# Patient Record
Sex: Female | Born: 1995
Health system: Southern US, Community
[De-identification: ages and names within clinical notes are randomized; demographics above are authoritative.]

## PROBLEM LIST (undated history)

## (undated) ENCOUNTER — Inpatient Hospital Stay: Payer: Self-pay

## (undated) DIAGNOSIS — K219 Gastro-esophageal reflux disease without esophagitis: Secondary | ICD-10-CM

## (undated) DIAGNOSIS — Z789 Other specified health status: Secondary | ICD-10-CM

## (undated) DIAGNOSIS — F419 Anxiety disorder, unspecified: Secondary | ICD-10-CM

## (undated) HISTORY — PX: INDUCED ABORTION: SHX677

## (undated) HISTORY — DX: Other specified health status: Z78.9

## (undated) HISTORY — PX: NO PAST SURGERIES: SHX2092

---
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
## 2015-10-10 ENCOUNTER — Encounter: Payer: Self-pay | Admitting: *Deleted

## 2015-10-10 ENCOUNTER — Observation Stay
Admission: EM | Admit: 2015-10-10 | Discharge: 2015-10-10 | Disposition: A | Discharge status: Home / Self Care | Payer: Medicaid Other | Attending: Certified Nurse Midwife | Admitting: Certified Nurse Midwife

## 2015-10-10 DIAGNOSIS — B373 Candidiasis of vulva and vagina: Secondary | ICD-10-CM | POA: Diagnosis not present

## 2015-10-10 DIAGNOSIS — Z3A34 34 weeks gestation of pregnancy: Secondary | ICD-10-CM | POA: Diagnosis not present

## 2015-10-10 DIAGNOSIS — O26893 Other specified pregnancy related conditions, third trimester: Secondary | ICD-10-CM | POA: Diagnosis not present

## 2015-10-10 DIAGNOSIS — O26852 Spotting complicating pregnancy, second trimester: Secondary | ICD-10-CM | POA: Diagnosis present

## 2015-10-10 DIAGNOSIS — O4693 Antepartum hemorrhage, unspecified, third trimester: Secondary | ICD-10-CM | POA: Diagnosis present

## 2015-10-10 MED ORDER — LACTATED RINGERS IV SOLN: 500.0000 mL | INTRAVENOUS | Status: DC | PRN

## 2015-10-10 NOTE — Progress Notes (Addendum)
   L&D Triage note  19 year old G3 P1011 with EDC=01/24/16 by LMP=11 week ultrasound presents at 24.6 weeks with c/o vaginal spotting that she noticed this Am when wiping. Has not had intercourse in the past 24 hours. No dysuria or fever. Has had a mucoid discharge x 3 weeks, but no vulvar irritation or itching. Has had some lower abdominal cramping intermittently and pain over pubic bone-especially when bending/moving etc. [facility]. Current medications: PNV NKDA  Exam: BP 128/64 mmHg  Pulse 88  Temp(Src) 98.4 F (36.9 C) (Oral)  Resp 16  General : in no acute distress Abdomen: soft, NT. Mild TTP over symphysis pubis FHR 140s with accelerations to 150s Toco: no contractions seen Spec exam: Vulva: inflammation of vestibule Vagina: clear to white mucoid discharge, no blood seen Cervix: closed/long/OOP Wet prep: positive increased WBC, a couple hyphae; no Trich or clue cells  A: Monilia vaginitis probably causing inflammation and some resulting spotting No evidence of PTL Age appropriate FHR tracing  P: DC home with RX for Terazol 3 cream FU at office as scheduled and prn persistent sx.  Farrel ConnersGUTIERREZ, Kenon Delashmit, CNM

## 2015-10-10 NOTE — OB Triage Note (Signed)
Recvd to OBS2 from ED per wheelchair with c/o bleeding when wiping this AM.  States that she had some cramping on the way to the hospital and thinks that she lost her mucous plug last week.  Changed to gown and to bed.  EFM applied.  Oriented to room and discussed plan of care.  Verbalized understanding and agreement with plan.

## 2015-12-08 NOTE — L&D Delivery Note (Signed)
VAGINAL DELIVERY NOTE:  Date of Delivery: 01/17/2016 Primary OB: [facility]  Gestational Age/EDD: [redacted]w[redacted]d 01/24/2016, by Other Basis Antepartum complications: Precipitous labor and delivery Attending Physician: Annamarie Major, MD, FACOG Delivery Type: spontaneous vaginal delivery  Anesthesia: none Laceration: vaginal Episiotomy: none Placenta: spontaneous Intrapartum complications: None Estimated Blood Loss: 100 mL GBS: Neg Procedure Details: Pt was having frequent ctxs with no cervic al change; Morphine / Phenergan IM given.  A few hours later pt awoke and was 10 cm, SROM, and soon precipitous delivery (I was present for all of pushing for 2 ctxs and delivery).  Right vaginal small side wall tear repaired under local.  Baby: Liveborn female, Apgars 8/9

## 2016-01-15 ENCOUNTER — Observation Stay
Admission: EM | Admit: 2016-01-15 | Discharge: 2016-01-16 | Disposition: A | Discharge status: Home / Self Care | Payer: Commercial Managed Care - HMO | Source: Home / Self Care | Admitting: Obstetrics & Gynecology

## 2016-01-16 NOTE — Discharge Summary (Signed)
20 yo G2P1001 at [redacted]w[redacted]d presents for labor evaluation. Cervix 2 cm per RN.   FHR strip reviewed by me: category 1 tracing with baseline 135 with mod variability, + accels, no decels. Ctx irregular.  Pt d/c home with labor precautions.  Given note be out of work tomorrow and rest. Will f/u at scheduled appt on 2/10.

## 2016-01-17 ENCOUNTER — Inpatient Hospital Stay
Admission: EM | Admit: 2016-01-17 | Discharge: 2016-01-18 | DRG: 775 | Disposition: A | Discharge status: Home / Self Care | Payer: Commercial Managed Care - HMO | Attending: Obstetrics & Gynecology | Admitting: Obstetrics & Gynecology

## 2016-01-17 ENCOUNTER — Encounter: Payer: Self-pay | Admitting: *Deleted

## 2016-01-17 DIAGNOSIS — Z3483 Encounter for supervision of other normal pregnancy, third trimester: Secondary | ICD-10-CM | POA: Diagnosis present

## 2016-01-17 DIAGNOSIS — Z3A39 39 weeks gestation of pregnancy: Secondary | ICD-10-CM

## 2016-01-17 DIAGNOSIS — O479 False labor, unspecified: Secondary | ICD-10-CM | POA: Diagnosis present

## 2016-01-17 MED ORDER — ONDANSETRON HCL 4 MG PO TABS: 4.0000 mg | ORAL_TABLET | ORAL | Status: DC | PRN

## 2016-01-17 MED ORDER — LANOLIN HYDROUS EX OINT: TOPICAL_OINTMENT | CUTANEOUS | Status: DC | PRN

## 2016-01-17 MED ORDER — IBUPROFEN 600 MG PO TABS
600.0000 mg | ORAL_TABLET | Freq: Four times a day (QID) | ORAL | Status: DC
  Administered 2016-01-17 – 2016-01-18 (×5): 600 mg via ORAL
  Filled 2016-01-17 (×5): qty 1

## 2016-01-17 MED ORDER — PROMETHAZINE HCL 25 MG/ML IJ SOLN
INTRAMUSCULAR | Status: AC
  Filled 2016-01-17: qty 1

## 2016-01-17 MED ORDER — MORPHINE SULFATE (PF) 10 MG/ML IV SOLN
INTRAVENOUS | Status: AC
  Filled 2016-01-17: qty 1

## 2016-01-17 MED ORDER — OXYTOCIN 10 UNIT/ML IJ SOLN
10.0000 [IU] | Freq: Once | INTRAMUSCULAR | Status: DC
  Filled 2016-01-17: qty 1

## 2016-01-17 MED ORDER — DIPHENHYDRAMINE HCL 25 MG PO CAPS: 25.0000 mg | ORAL_CAPSULE | Freq: Four times a day (QID) | ORAL | Status: DC | PRN

## 2016-01-17 MED ORDER — ONDANSETRON HCL 4 MG/2ML IJ SOLN: 4.0000 mg | INTRAMUSCULAR | Status: DC | PRN

## 2016-01-17 MED ORDER — PROMETHAZINE HCL 25 MG/ML IJ SOLN
12.5000 mg | Freq: Once | INTRAMUSCULAR | Status: AC
  Administered 2016-01-17: 25 mg via INTRAMUSCULAR

## 2016-01-17 MED ORDER — WITCH HAZEL-GLYCERIN EX PADS
1.0000 | MEDICATED_PAD | CUTANEOUS | Status: DC | PRN
Start: 2016-01-17 — End: 2016-01-18

## 2016-01-17 MED ORDER — OXYCODONE-ACETAMINOPHEN 5-325 MG PO TABS
1.0000 | ORAL_TABLET | ORAL | Status: DC | PRN
Start: 2016-01-17 — End: 2016-01-18
  Administered 2016-01-17 – 2016-01-18 (×2): 1 via ORAL
  Filled 2016-01-17 (×2): qty 1

## 2016-01-17 MED ORDER — SIMETHICONE 80 MG PO CHEW: 80.0000 mg | CHEWABLE_TABLET | ORAL | Status: DC | PRN

## 2016-01-17 MED ORDER — OXYCODONE-ACETAMINOPHEN 5-325 MG PO TABS: 2.0000 | ORAL_TABLET | ORAL | Status: DC | PRN

## 2016-01-17 MED ORDER — SENNOSIDES-DOCUSATE SODIUM 8.6-50 MG PO TABS
2.0000 | ORAL_TABLET | ORAL | Status: DC
  Administered 2016-01-17: 2 via ORAL
  Filled 2016-01-17: qty 2

## 2016-01-17 MED ORDER — ACETAMINOPHEN 325 MG PO TABS: 650.0000 mg | ORAL_TABLET | ORAL | Status: DC | PRN

## 2016-01-17 MED ORDER — BENZOCAINE-MENTHOL 20-0.5 % EX AERO
1.0000 "application " | INHALATION_SPRAY | CUTANEOUS | Status: DC | PRN
  Administered 2016-01-17: 1 via TOPICAL
  Filled 2016-01-17: qty 56

## 2016-01-17 MED ORDER — ZOLPIDEM TARTRATE 5 MG PO TABS: 5.0000 mg | ORAL_TABLET | Freq: Every evening | ORAL | Status: DC | PRN

## 2016-01-17 MED ORDER — MORPHINE SULFATE (PF) 10 MG/ML IV SOLN: 10.0000 mg | Freq: Once | INTRAVENOUS | Status: DC

## 2016-01-17 MED ORDER — DIBUCAINE 1 % RE OINT: 1.0000 "application " | TOPICAL_OINTMENT | RECTAL | Status: DC | PRN

## 2016-01-17 MED ORDER — ONDANSETRON HCL 4 MG/2ML IJ SOLN: 4.0000 mg | Freq: Four times a day (QID) | INTRAMUSCULAR | Status: DC | PRN

## 2016-01-17 MED ORDER — OXYTOCIN 40 UNITS IN LACTATED RINGERS INFUSION - SIMPLE MED
INTRAVENOUS | Status: AC
  Filled 2016-01-17: qty 1000

## 2016-01-17 NOTE — OB Triage Note (Signed)
Patient presents with c/o contractions since yesterday morning.  Denies and s/s srom, denies any vaginal bleeding.  efm and toco applied.  fhr-138.  Reports active fetus.  UC palpate mild-moderate.  Patient states she is tired since she hasn't slept in 2 days.  sve performed, cervix posterior

## 2016-01-17 NOTE — Discharge Instructions (Signed)

## 2016-01-17 NOTE — Discharge Summary (Signed)
  Obstetrical Discharge Summary  Date of Admission: 01/17/2016 Date of Discharge: 01/18/2016 Discharge Diagnosis: Term Pregnancy-delivered Primary OB:  [facility]   Gestational Age at Delivery: [redacted]w[redacted]d  Antepartum complications: none Date of Delivery: 01/17/16  Delivered By: Tiburcio Pea Delivery Type: spontaneous vaginal delivery Intrapartum complications/course: Precipitous Anesthesia: none Placenta: spontaneous Laceration: vaginal Episiotomy: none Live born Female Birth Weight:  7#6.5oz APGAR: 8, 9   Post partum course: Since the delivery, patient has tolerated activity, diet, and daily functions without difficulty or complication.  Min lochia.  Breast feeding and having some nipple soreness..  No signs of depression currently.   Postpartum Exam:General appearance: alert, cooperative and no distress GI: Fundus firm and ML/ U-1/ NT Extremities: Homans sign is negative, no sign of DVT Lochia appropriate BP 112/63 mmHg  Pulse 97  Temp(Src) 97.7 F (36.5 C) (Oral)  Resp 18  Ht  (1.651 m)  Wt 79.379 kg (175 lb)  BMI 29.12 kg/m2  SpO2 100%  Breastfeeding? Unknown   Disposition: home with infant/ Kelly Hudson Rh Immune globulin given: not applicable Rubella vaccine given: not applicable Varicella vaccine given: not applicable Tdap vaccine given in AP or PP setting: given during prenatal care Flu vaccine given in AP or PP setting: given during prenatal care Contraception: Nexplanon-can come to office in 1-2 weeks to order  Results for orders placed or performed during the hospital encounter of 01/17/16 (from the past 24 hour(s))  CBC     Status: Abnormal   Collection Time: 01/18/16  6:00 AM  Result Value Ref Range   WBC 18.1 (H) 3.6 - 11.0 K/uL   RBC 3.57 (L) 3.80 - 5.20 MIL/uL   Hemoglobin 10.2 (L) 12.0 - 16.0 g/dL   HCT 40.9 (L) 81.1 - 91.4 %   MCV 86.2 80.0 - 100.0 fL   MCH 28.6 26.0 - 34.0 pg   MCHC 33.1 32.0 - 36.0 g/dL   RDW 78.2 95.6 - 21.3 %   Platelets 151 150 - 440  K/uL   Prenatal Labs: O+//Rubella Immune//Varicella Immune//RPR negative//HIV negative/HepB Surface Ag negative//plans to breastfeed  Plan:  Kelly Hudson was discharged to home in good condition. Follow-up appointment with [facility] provider in 6 weeks  Discharge Medications:   Medication List    TAKE these medications        ibuprofen 600 MG tablet  Commonly known as:  ADVIL,MOTRIN  Take 1 tablet (600 mg total) by mouth every 6 (six) hours as needed.     oxyCODONE-acetaminophen 5-325 MG tablet  Commonly known as:  PERCOCET/ROXICET  Take 1 tablet by mouth every 4 (four) hours as needed for moderate pain or severe pain.     prenatal vitamin w/FE, FA 27-1 MG Tabs tablet  TK 1 T PO QD BETWEEN MEALS         Kelly Hudson, CNM

## 2016-01-17 NOTE — H&P (Signed)
Obstetrics Admission History & Physical   CC: Contractions   HPI:  21 y.o. G2P1001 @ [redacted]w[redacted]d (01/24/2016, by Other Basis). Admitted on 01/17/2016:   Patient Active Problem List   Diagnosis Date Noted  . Labor and delivery, indication for care 01/16/2016  . Spotting affecting pregnancy in second trimester 10/10/2015     Presents for Pain from reg ctxs, no VB or ROM.  Prenatal care at: at [facility]  PMHx: No past medical history on file. PSHx: No past surgical history on file. Medications:  Prescriptions prior to admission  Medication Sig Dispense Refill Last Dose  . prenatal vitamin w/FE, FA (PRENATAL 1 + 1) 27-1 MG TABS tablet TK 1 T PO QD BETWEEN MEALS  9 Unknown at Unknown time   Allergies: has No Known Allergies. OBHx:  OB History  Gravida Para Term Preterm AB SAB TAB Ectopic Multiple Living  # Outcome Date GA Lbr Len/2nd Weight Sex Delivery Anes PTL Lv  2 Current           1 Term 01/13/11    M Vag-Spont  N Y     ZOX:[id number] except as detailed in HPI. Soc Hx: Pregnancy welcomed  Objective:   Filed Vitals:   01/17/16 0143  BP: 126/72  Pulse: 98  Temp: 97.7 F (36.5 C)   General: Well nourished, well developed female in no acute distress.  Skin: Warm and dry.  Cardiovascular:Regular rate and rhythm. Respiratory: Clear to auscultation bilateral. Normal respiratory effort Abdomen: mild tenderness, Vtx Neuro/Psych: Normal mood and affect.   Pelvic exam: is not limited by body habitus EGBUS: within normal limits Vagina: within normal limits and with normal mucosa blood in the vault Cervix: difficult due to low head and post cervix, 2/60 Uterus: Spontaneous uterine activity  Adnexa: not evaluated  EFM:FHR: 150 bpm, variability: moderate,  accelerations:  Present,  decelerations:  Absent Toco: Frequency: Every 5-8 minutes   Perinatal info:  Blood type: O positive Rubella- Immune Varicella -Immune TDaP Given during third trimester  of this pregnancy RPR NR / HIV Neg/ HBsAg Neg   Assessment & Plan:   20 y.o. G2P1001 @ [redacted]w[redacted]d, Admitted on 01/17/2016: evaluate for early labor    Observe for cervical change and Fetal Wellbeing Reassuring  As this is second visit to triage in 2 nights, will treat with pain medicine (IM Morphine) and allow to rest then re-evaluate; if cervical change then keep for labor mgt.  Annamarie Major, MD

## 2016-01-18 LAB — CBC
HEMATOCRIT: 30.8 % — AB (ref 35.0–47.0)
Hemoglobin: 10.2 g/dL — ABNORMAL LOW (ref 12.0–16.0)
MCH: 28.6 pg (ref 26.0–34.0)
MCHC: 33.1 g/dL (ref 32.0–36.0)
MCV: 86.2 fL (ref 80.0–100.0)
Platelets: 151 10*3/uL (ref 150–440)
RBC: 3.57 MIL/uL — ABNORMAL LOW (ref 3.80–5.20)
RDW: 14 % (ref 11.5–14.5)
WBC: 18.1 10*3/uL — ABNORMAL HIGH (ref 3.6–11.0)

## 2016-01-18 MED ORDER — OXYCODONE-ACETAMINOPHEN 5-325 MG PO TABS: 1.0000 | ORAL_TABLET | ORAL | Status: DC | PRN

## 2016-01-18 MED ORDER — IBUPROFEN 600 MG PO TABS: 600.0000 mg | ORAL_TABLET | Freq: Four times a day (QID) | ORAL | Status: DC | PRN

## 2016-01-18 NOTE — Progress Notes (Signed)
D/C order from MD.  Reviewed d/c instructions and prescriptions with patient and answered any questions.  Patient d/c home with infant via wheelchair by nursing/auxillary. 

## 2017-08-02 ENCOUNTER — Ambulatory Visit (INDEPENDENT_AMBULATORY_CARE_PROVIDER_SITE_OTHER): Payer: BLUE CROSS/BLUE SHIELD | Admitting: Obstetrics and Gynecology

## 2017-08-02 ENCOUNTER — Encounter: Payer: Self-pay | Admitting: Obstetrics and Gynecology

## 2017-08-02 VITALS — BP 100/60 | HR 72 | Ht 65.0 in | Wt 151.0 lb

## 2017-08-02 DIAGNOSIS — B373 Candidiasis of vulva and vagina: Secondary | ICD-10-CM | POA: Diagnosis not present

## 2017-08-02 DIAGNOSIS — Z113 Encounter for screening for infections with a predominantly sexual mode of transmission: Secondary | ICD-10-CM

## 2017-08-02 DIAGNOSIS — B3731 Acute candidiasis of vulva and vagina: Secondary | ICD-10-CM

## 2017-08-02 DIAGNOSIS — Z01419 Encounter for gynecological examination (general) (routine) without abnormal findings: Secondary | ICD-10-CM | POA: Diagnosis not present

## 2017-08-02 DIAGNOSIS — Z30011 Encounter for initial prescription of contraceptive pills: Secondary | ICD-10-CM

## 2017-08-02 DIAGNOSIS — Z124 Encounter for screening for malignant neoplasm of cervix: Secondary | ICD-10-CM | POA: Diagnosis not present

## 2017-08-02 LAB — POCT WET PREP WITH KOH
CLUE CELLS WET PREP PER HPF POC: NEGATIVE
KOH PREP POC: NEGATIVE
TRICHOMONAS UA: NEGATIVE

## 2017-08-02 MED ORDER — LEVONORGESTREL-ETHINYL ESTRAD 0.1-20 MG-MCG PO TABS: 1.0000 | ORAL_TABLET | Freq: Every day | ORAL | 12 refills | Status: DC

## 2017-08-02 MED ORDER — FLUCONAZOLE 150 MG PO TABS: 150.0000 mg | ORAL_TABLET | Freq: Once | ORAL | 0 refills | Status: AC

## 2017-08-02 NOTE — Progress Notes (Signed)
Chief Complaint  Patient presents with  . Gynecologic Exam     HPI:      [name] is a 21 y.o. [id number] who LMP was Patient's last menstrual period was 07/14/2017 (exact date)., presents today for her annual examination.  Her menses are regular every 28-30 days, lasting 4 days.  Dysmenorrhea mild, occurring first 1-2 days of flow. She does not have intermenstrual bleeding.  Sex activity: single partner, contraception - none. She was on OCPs from the health dept but ran out a couple months ago. She is not using condoms. She would like to restart OCPs.  Last Pap: June 21, 2015  Results were: no abnormalities Hx of STDs: none  There is no FH of breast cancer. There is no FH of ovarian cancer. The patient does do self-breast exams.  Tobacco use: The patient denies current or previous tobacco use. Alcohol use: social drinker No drug use.  Exercise: moderately active  She does not get adequate calcium and Vitamin D in her diet.  She has noticed mild vaginal irritation for the past few days, with an increased d/c, no odor. She has been swimming a lot recently. No recent abx use, no meds to treat.  Past Medical History:  Diagnosis Date  . No known health problems     Past Surgical History:  Procedure Laterality Date  . NO PAST SURGERIES      Family History  Problem Relation Age of Onset  . Other Maternal Grandmother        brain tumor  . Diabetes Paternal Grandfather   . Cancer Neg Hx   . Hypertension Neg Hx     Social History   Social History  . Marital status: Single    Spouse name: N/A  . Number of children: N/A  . Years of education: N/A   Occupational History  . Not on file.   Social History Main Topics  . Smoking status: Never Smoker  . Smokeless tobacco: Never Used  . Alcohol use Yes  . Drug use: No  . Sexual activity: Yes    Birth control/ protection: None   Other Topics Concern  . Not on file   Social History Narrative  . No  narrative on file    No outpatient prescriptions have been marked as taking for the 08/02/17 encounter (Office Visit) with Copland, Ilona Sorrel, PA-C.     ROS:  Review of Systems  Constitutional: Negative for fatigue, fever and unexpected weight change.  Respiratory: Negative for cough, shortness of breath and wheezing.   Cardiovascular: Negative for chest pain, palpitations and leg swelling.  Gastrointestinal: Negative for blood in stool, constipation, diarrhea, nausea and vomiting.  Endocrine: Negative for cold intolerance, heat intolerance and polyuria.  Genitourinary: Positive for vaginal discharge. Negative for dyspareunia, dysuria, flank pain, frequency, genital sores, hematuria, menstrual problem, pelvic pain, urgency, vaginal bleeding and vaginal pain.  Musculoskeletal: Negative for back pain, joint swelling and myalgias.  Skin: Negative for rash.  Neurological: Negative for dizziness, syncope, light-headedness, numbness and headaches.  Hematological: Negative for adenopathy.  Psychiatric/Behavioral: Negative for agitation, confusion, sleep disturbance and suicidal ideas. The patient is not nervous/anxious.      Objective: BP 100/60   Pulse 72   Ht 5\' 5"  (1.651 m)   Wt 151 lb (68.5 kg)   LMP 07/14/2017 (Exact Date)   BMI 25.13 kg/m    Physical Exam  Constitutional: She is oriented to person, place, and time. She appears well-developed  and well-nourished.  Genitourinary: Vagina normal and uterus normal. There is no rash or tenderness on the right labia. There is no rash or tenderness on the left labia. No erythema or tenderness in the vagina. No vaginal discharge found. Right adnexum does not display mass and does not display tenderness. Left adnexum does not display mass and does not display tenderness. Cervix does not exhibit motion tenderness or polyp. Uterus is not enlarged or tender.  Genitourinary Comments: ERYTHEMA AT INTROITUS   Neck: Normal range of motion. No  thyromegaly present.  Cardiovascular: Normal rate, regular rhythm and normal heart sounds.   No murmur heard. Pulmonary/Chest: Effort normal and breath sounds normal. Right breast exhibits no mass, no nipple discharge, no skin change and no tenderness. Left breast exhibits no mass, no nipple discharge, no skin change and no tenderness.  Abdominal: Soft. There is no tenderness. There is no guarding.  Musculoskeletal: Normal range of motion.  Neurological: She is alert and oriented to person, place, and time. No cranial nerve deficit.  Psychiatric: She has a normal mood and affect. Her behavior is normal.  Vitals reviewed.   Results: Results for orders placed or performed in visit on 08/02/17 (from the past 24 hour(s))  POCT Wet Prep with KOH     Status: Abnormal   Collection Time: 08/02/17  9:48 AM  Result Value Ref Range   Trichomonas, UA Negative    Clue Cells Wet Prep HPF POC NEG    Epithelial Wet Prep HPF POC  Few, Moderate, Many, Too numerous to count   Yeast Wet Prep HPF POC FEW    Bacteria Wet Prep HPF POC  Few   RBC Wet Prep HPF POC     WBC Wet Prep HPF POC     KOH Prep POC Negative Negative    Assessment/Plan: Encounter for annual routine gynecological examination  Cervical cancer screening - Plan: IGP,CtNgTv,rfx Aptima HPV ASCU  Screening for STD (sexually transmitted disease) - Plan: IGP,CtNgTv,rfx Aptima HPV ASCU  Candidal vaginitis - Pos exam/wet prep. Rx diflucan eRxd. F/u prn.  - Plan: fluconazole (DIFLUCAN) 150 MG tablet, POCT Wet Prep with KOH  Encounter for initial prescription of contraceptive pills - OCP restart wtih next menses. Condoms. Rx eRxd.  - Plan: levonorgestrel-ethinyl estradiol (AVIANE) 0.1-20 MG-MCG tablet  Meds ordered this encounter  Medications  . levonorgestrel-ethinyl estradiol (AVIANE) 0.1-20 MG-MCG tablet    Sig: Take 1 tablet by mouth daily.    Dispense:  28 tablet    Refill:  12  . fluconazole (DIFLUCAN) 150 MG tablet    Sig: Take 1  tablet (150 mg total) by mouth once.    Dispense:  1 tablet    Refill:  0             GYN counsel STD prevention, adequate intake of calcium and vitamin D     F/U  Return in about 1 year (around 08/02/2018).  Alicia B. Copland, PA-C 08/02/2017 9:49 AM

## 2017-08-05 LAB — IGP,CTNGTV,RFX APTIMA HPV ASCU
CHLAMYDIA, NUC. ACID AMP: NEGATIVE
Gonococcus, Nuc. Acid Amp: NEGATIVE
PAP SMEAR COMMENT: 0
Trich vag by NAA: NEGATIVE

## 2018-03-04 ENCOUNTER — Other Ambulatory Visit
Admission: RE | Admit: 2018-03-04 | Discharge: 2018-03-04 | Disposition: A | Discharge status: Home / Self Care | Payer: BLUE CROSS/BLUE SHIELD | Source: Ambulatory Visit | Attending: Internal Medicine | Admitting: Internal Medicine

## 2018-03-04 DIAGNOSIS — M25475 Effusion, left foot: Secondary | ICD-10-CM | POA: Diagnosis present

## 2018-03-04 DIAGNOSIS — M25572 Pain in left ankle and joints of left foot: Secondary | ICD-10-CM | POA: Insufficient documentation

## 2018-03-04 LAB — FIBRIN DERIVATIVES D-DIMER (ARMC ONLY): Fibrin derivatives D-dimer (ARMC): 189.8 ng/mL (FEU) (ref 0.00–499.00)

## 2019-01-16 ENCOUNTER — Ambulatory Visit
Admission: RE | Admit: 2019-01-16 | Discharge: 2019-01-16 | Disposition: A | Discharge status: Home / Self Care | Payer: 59 | Source: Ambulatory Visit | Attending: Adult Health | Admitting: Adult Health

## 2019-01-16 ENCOUNTER — Other Ambulatory Visit: Payer: Self-pay | Admitting: Adult Health

## 2019-01-16 DIAGNOSIS — M549 Dorsalgia, unspecified: Secondary | ICD-10-CM

## 2019-01-16 DIAGNOSIS — M542 Cervicalgia: Secondary | ICD-10-CM

## 2019-02-01 ENCOUNTER — Other Ambulatory Visit: Payer: Self-pay | Admitting: Adult Health

## 2019-02-01 ENCOUNTER — Other Ambulatory Visit (HOSPITAL_COMMUNITY): Payer: Self-pay | Admitting: Adult Health

## 2019-02-01 DIAGNOSIS — R109 Unspecified abdominal pain: Secondary | ICD-10-CM

## 2019-02-01 DIAGNOSIS — R110 Nausea: Secondary | ICD-10-CM

## 2019-02-08 ENCOUNTER — Other Ambulatory Visit: Payer: Self-pay

## 2019-02-08 ENCOUNTER — Ambulatory Visit
Admission: RE | Admit: 2019-02-08 | Discharge: 2019-02-08 | Disposition: A | Discharge status: Home / Self Care | Payer: 59 | Source: Ambulatory Visit | Attending: Adult Health | Admitting: Adult Health

## 2019-02-08 DIAGNOSIS — R110 Nausea: Secondary | ICD-10-CM | POA: Diagnosis present

## 2019-02-08 DIAGNOSIS — R109 Unspecified abdominal pain: Secondary | ICD-10-CM | POA: Diagnosis present

## 2019-02-08 MED ORDER — TECHNETIUM TC 99M MEBROFENIN IV KIT
4.9550 | PACK | Freq: Once | INTRAVENOUS | Status: AC | PRN
  Administered 2019-02-08: 4.955 via INTRAVENOUS

## 2019-03-15 ENCOUNTER — Other Ambulatory Visit: Payer: Self-pay | Admitting: Adult Health

## 2019-03-15 DIAGNOSIS — M545 Low back pain, unspecified: Secondary | ICD-10-CM

## 2019-03-15 DIAGNOSIS — M542 Cervicalgia: Secondary | ICD-10-CM

## 2019-04-18 ENCOUNTER — Ambulatory Visit: Payer: 59

## 2019-04-18 ENCOUNTER — Ambulatory Visit: Admission: RE | Admit: 2019-04-18 | Payer: 59 | Source: Ambulatory Visit

## 2019-04-21 ENCOUNTER — Ambulatory Visit
Admission: RE | Admit: 2019-04-21 | Discharge: 2019-04-21 | Disposition: A | Discharge status: Home / Self Care | Payer: 59 | Source: Ambulatory Visit | Attending: Adult Health | Admitting: Adult Health

## 2019-04-21 ENCOUNTER — Other Ambulatory Visit: Payer: Self-pay

## 2019-04-21 ENCOUNTER — Encounter: Payer: Self-pay | Admitting: Surgery

## 2019-04-21 ENCOUNTER — Ambulatory Visit (INDEPENDENT_AMBULATORY_CARE_PROVIDER_SITE_OTHER): Payer: 59 | Admitting: Surgery

## 2019-04-21 VITALS — BP 115/76 | HR 76 | Temp 97.7°F | Ht 66.0 in | Wt 153.0 lb

## 2019-04-21 DIAGNOSIS — K828 Other specified diseases of gallbladder: Secondary | ICD-10-CM | POA: Diagnosis not present

## 2019-04-21 DIAGNOSIS — M545 Low back pain, unspecified: Secondary | ICD-10-CM

## 2019-04-21 DIAGNOSIS — M542 Cervicalgia: Secondary | ICD-10-CM | POA: Insufficient documentation
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
## 2019-04-24 ENCOUNTER — Telehealth: Payer: Self-pay | Admitting: *Deleted

## 2019-04-24 NOTE — Telephone Encounter (Signed)
Message left for patient to call the office.   Patient needs to be scheduled for gallbladder surgery the week of 05-29-19 or 06-05-19 on a Thursday per Dr. Aleen Campi at [facility].   The patient will need to have COVID-19 testing done prior.   No pre-op visit will be required. History and physical will be updated the morning of procedure.

## 2019-04-24 NOTE — Telephone Encounter (Signed)
Patient called the office back.   Patient's surgery to be scheduled for 06-01-19 at [facility] with Dr. Aleen Campi.  She will need to go for COVID-19 testing on 05-29-19 between 10:30 am and 12:30 pm at the [facility] drive thru. Patient aware to self isolate after, have no visitors, wash hands frequently, and avoid touching her face.   The patient is aware she will be contacted by the Pre-Admission Testing Department to complete a phone interview sometime in the near future.  Patient aware to be NPO after midnight and have a driver.   She is aware to check in at the [facility] where she will be screened for the coronavirus and then sent to [facility] Surgery.   Patient aware that she may have no visitors and driver will need to wait in the car due to COVID-19 restrictions.   The patient verbalizes understanding of the above.   The patient is aware to call the office should she have further questions.

## 2019-05-22 ENCOUNTER — Encounter
Admission: RE | Admit: 2019-05-22 | Discharge: 2019-05-22 | Disposition: A | Discharge status: Home / Self Care | Payer: 59 | Source: Ambulatory Visit | Attending: Surgery | Admitting: Surgery

## 2019-05-22 ENCOUNTER — Telehealth: Payer: Self-pay | Admitting: *Deleted

## 2019-05-22 ENCOUNTER — Other Ambulatory Visit: Payer: Self-pay

## 2019-05-22 HISTORY — DX: Anxiety disorder, unspecified: F41.9

## 2019-05-22 HISTORY — DX: Gastro-esophageal reflux disease without esophagitis: K21.9

## 2019-05-22 NOTE — Telephone Encounter (Signed)
Patient would like a work note 06/12/2019.

## 2019-05-22 NOTE — Telephone Encounter (Signed)
Patient called and stated that she would like to get a doctors note for work stating when and how long she needs to be out of work. Her surgery is scheduled for 06/01/19 with Dr.Piscoya

## 2019-05-22 NOTE — Patient Instructions (Signed)
Your procedure is scheduled on: Thursday 06/01/19 Report to [place]. To find out your arrival time please call [phone] between 1PM - 3PM on Wednesday 05/31/19.  Remember: Instructions that are not followed completely may result in serious medical risk, up to and including death, or upon the discretion of your surgeon and anesthesiologist your surgery may need to be rescheduled.     _X__ 1. Do not eat food after midnight the night before your procedure.                 No gum chewing or hard candies. You may drink clear liquids up to 2 hours                 before you are scheduled to arrive for your surgery- DO not drink clear                 liquids within 2 hours of the start of your surgery.                 Clear Liquids include:  water, apple juice without pulp, clear carbohydrate                 drink such as Clearfast or Gatorade, Black Coffee or Tea (Do not add                 anything to coffee or tea).  __X__2.  On the morning of surgery brush your teeth with toothpaste and water, you                 may rinse your mouth with mouthwash if you wish.  Do not swallow any              toothpaste of mouthwash.     _X__ 3.  No Alcohol for 24 hours before or after surgery.   _X__ 4.  Do Not Smoke or use e-cigarettes For 24 Hours Prior to Your Surgery.                 Do not use any chewable tobacco products for at least 6 hours prior to                 surgery.  ____  5.  Bring all medications with you on the day of surgery if instructed.   __X__  6.  Notify your doctor if there is any change in your medical condition      (cold, fever, infections).     Do not wear jewelry, make-up, hairpins, clips or nail polish. Do not wear lotions, powders, or perfumes.  Do not shave 48 hours prior to surgery. Men may shave face and neck. Do not bring valuables to the hospital.    [facility] is not responsible for any belongings or  valuables.  Contacts, dentures/partials or body piercings may not be worn into surgery. Bring a case for your contacts, glasses or hearing aids, a denture cup will be supplied. Leave your suitcase in the car. After surgery it may be brought to your room. For patients admitted to the hospital, discharge time is determined by your treatment team.   Patients discharged the day of surgery will not be allowed to drive home.   Please read over the following fact sheets that you were given:   MRSA Information  __X__ Take these medicines the morning of surgery with A SIP OF WATER:  1. pantoprazole (PROTONIX  2. FLUoxetine (PROZAC) if needed  3.   4.  5.  6.  ____ Fleet Enema (as directed)   __X__ Use CHG Soap/SAGE wipes as directed  ____ Use inhalers on the day of surgery  ____ Stop metformin/Janumet/Farxiga 2 days prior to surgery    ____ Take 1/2 of usual insulin dose the night before surgery. No insulin the morning          of surgery.   ____ Stop Blood Thinners Coumadin/Plavix/Xarelto/Pleta/Pradaxa/Eliquis/Effient/Aspirin  on   Or contact your Surgeon, Cardiologist or Medical Doctor regarding  ability to stop your blood thinners  __X__ Stop Anti-inflammatories 7 days before surgery such as Advil, Ibuprofen, Motrin,  BC or Goodies Powder, Naprosyn, Naproxen, Aleve, Aspirin    __X__ Stop all herbal supplements, fish oil or vitamin E until after surgery.    ____ Bring C-Pap to the hospital.

## 2019-05-29 ENCOUNTER — Other Ambulatory Visit
Admission: RE | Admit: 2019-05-29 | Discharge: 2019-05-29 | Disposition: A | Discharge status: Home / Self Care | Payer: 59 | Source: Ambulatory Visit | Attending: Surgery | Admitting: Surgery

## 2019-05-29 ENCOUNTER — Other Ambulatory Visit: Payer: Self-pay

## 2019-05-29 DIAGNOSIS — Z1159 Encounter for screening for other viral diseases: Secondary | ICD-10-CM | POA: Insufficient documentation

## 2019-05-30 LAB — NOVEL CORONAVIRUS, NAA (HOSP ORDER, SEND-OUT TO REF LAB; TAT 18-24 HRS): SARS-CoV-2, NAA: NOT DETECTED

## 2019-06-01 ENCOUNTER — Encounter
Admission: RE | Disposition: A | Discharge status: Home / Self Care | Payer: Self-pay | Source: Home / Self Care | Attending: Surgery

## 2019-06-01 ENCOUNTER — Ambulatory Visit
Admission: RE | Admit: 2019-06-01 | Discharge: 2019-06-01 | Disposition: A | Discharge status: Home / Self Care | Payer: 59 | Attending: Surgery | Admitting: Surgery

## 2019-06-01 ENCOUNTER — Encounter: Payer: Self-pay | Admitting: Emergency Medicine

## 2019-06-01 ENCOUNTER — Ambulatory Visit: Payer: 59 | Admitting: Certified Registered"

## 2019-06-01 DIAGNOSIS — K828 Other specified diseases of gallbladder: Secondary | ICD-10-CM

## 2019-06-01 DIAGNOSIS — Z791 Long term (current) use of non-steroidal anti-inflammatories (NSAID): Secondary | ICD-10-CM | POA: Insufficient documentation

## 2019-06-01 DIAGNOSIS — Z793 Long term (current) use of hormonal contraceptives: Secondary | ICD-10-CM | POA: Insufficient documentation

## 2019-06-01 DIAGNOSIS — Z79899 Other long term (current) drug therapy: Secondary | ICD-10-CM | POA: Insufficient documentation

## 2019-06-01 DIAGNOSIS — K8020 Calculus of gallbladder without cholecystitis without obstruction: Secondary | ICD-10-CM | POA: Diagnosis not present

## 2019-06-01 DIAGNOSIS — K219 Gastro-esophageal reflux disease without esophagitis: Secondary | ICD-10-CM | POA: Insufficient documentation

## 2019-06-01 HISTORY — PX: CHOLECYSTECTOMY: SHX55

## 2019-06-01 LAB — POCT PREGNANCY, URINE: Preg Test, Ur: NEGATIVE

## 2019-06-01 SURGERY — LAPAROSCOPIC CHOLECYSTECTOMY
Anesthesia: General | Site: Abdomen

## 2019-06-01 MED ORDER — SUCCINYLCHOLINE CHLORIDE 20 MG/ML IJ SOLN
INTRAMUSCULAR | Status: AC
  Filled 2019-06-01: qty 1

## 2019-06-01 MED ORDER — IBUPROFEN 600 MG PO TABS: 600.0000 mg | ORAL_TABLET | Freq: Three times a day (TID) | ORAL | 0 refills | Status: DC | PRN

## 2019-06-01 MED ORDER — DEXAMETHASONE SODIUM PHOSPHATE 10 MG/ML IJ SOLN
INTRAMUSCULAR | Status: DC | PRN
  Administered 2019-06-01: 10 mg via INTRAVENOUS

## 2019-06-01 MED ORDER — PROMETHAZINE HCL 25 MG/ML IJ SOLN
6.2500 mg | INTRAMUSCULAR | Status: DC | PRN
  Administered 2019-06-01: 6.25 mg via INTRAVENOUS

## 2019-06-01 MED ORDER — LIDOCAINE HCL (CARDIAC) PF 100 MG/5ML IV SOSY
PREFILLED_SYRINGE | INTRAVENOUS | Status: DC | PRN
  Administered 2019-06-01: 100 mg via INTRAVENOUS

## 2019-06-01 MED ORDER — SUGAMMADEX SODIUM 200 MG/2ML IV SOLN
INTRAVENOUS | Status: DC | PRN
  Administered 2019-06-01: 20 mg via INTRAVENOUS

## 2019-06-01 MED ORDER — CHLORHEXIDINE GLUCONATE CLOTH 2 % EX PADS: 6.0000 | MEDICATED_PAD | Freq: Once | CUTANEOUS | Status: DC

## 2019-06-01 MED ORDER — SODIUM CHLORIDE FLUSH 0.9 % IV SOLN
INTRAVENOUS | Status: AC
  Filled 2019-06-01: qty 10

## 2019-06-01 MED ORDER — FAMOTIDINE 20 MG PO TABS
ORAL_TABLET | ORAL | Status: AC
  Administered 2019-06-01: 07:00:00 20 mg
  Filled 2019-06-01: qty 1

## 2019-06-01 MED ORDER — EPHEDRINE SULFATE 50 MG/ML IJ SOLN
INTRAMUSCULAR | Status: AC
  Filled 2019-06-01: qty 1

## 2019-06-01 MED ORDER — LACTATED RINGERS IV SOLN
INTRAVENOUS | Status: DC
  Administered 2019-06-01: 07:00:00 via INTRAVENOUS

## 2019-06-01 MED ORDER — CEFAZOLIN SODIUM-DEXTROSE 2-4 GM/100ML-% IV SOLN
INTRAVENOUS | Status: AC
  Filled 2019-06-01: qty 100

## 2019-06-01 MED ORDER — GABAPENTIN 300 MG PO CAPS
ORAL_CAPSULE | ORAL | Status: AC
  Administered 2019-06-01: 07:00:00 300 mg via ORAL
  Filled 2019-06-01: qty 1

## 2019-06-01 MED ORDER — OXYCODONE HCL 5 MG PO TABS: 5.0000 mg | ORAL_TABLET | ORAL | 0 refills | Status: DC | PRN

## 2019-06-01 MED ORDER — FENTANYL CITRATE (PF) 100 MCG/2ML IJ SOLN
25.0000 ug | INTRAMUSCULAR | Status: DC | PRN
  Administered 2019-06-01 (×4): 25 ug via INTRAVENOUS

## 2019-06-01 MED ORDER — FENTANYL CITRATE (PF) 100 MCG/2ML IJ SOLN
INTRAMUSCULAR | Status: AC
  Filled 2019-06-01: qty 2

## 2019-06-01 MED ORDER — ROCURONIUM BROMIDE 100 MG/10ML IV SOLN
INTRAVENOUS | Status: AC
  Filled 2019-06-01: qty 1

## 2019-06-01 MED ORDER — BUPIVACAINE-EPINEPHRINE (PF) 0.25% -1:200000 IJ SOLN
INTRAMUSCULAR | Status: AC
  Filled 2019-06-01: qty 30

## 2019-06-01 MED ORDER — LIDOCAINE HCL (PF) 2 % IJ SOLN
INTRAMUSCULAR | Status: AC
  Filled 2019-06-01: qty 10

## 2019-06-01 MED ORDER — BUPIVACAINE-EPINEPHRINE (PF) 0.25% -1:200000 IJ SOLN
INTRAMUSCULAR | Status: DC | PRN
  Administered 2019-06-01: 30 mL

## 2019-06-01 MED ORDER — ONDANSETRON HCL 4 MG/2ML IJ SOLN
INTRAMUSCULAR | Status: AC
  Filled 2019-06-01: qty 2

## 2019-06-01 MED ORDER — FENTANYL CITRATE (PF) 100 MCG/2ML IJ SOLN
INTRAMUSCULAR | Status: AC
  Administered 2019-06-01: 25 ug via INTRAVENOUS
  Filled 2019-06-01: qty 2

## 2019-06-01 MED ORDER — MIDAZOLAM HCL 2 MG/2ML IJ SOLN
INTRAMUSCULAR | Status: AC
  Filled 2019-06-01: qty 2

## 2019-06-01 MED ORDER — ACETAMINOPHEN 500 MG PO TABS
1000.0000 mg | ORAL_TABLET | ORAL | Status: AC
  Administered 2019-06-01: 07:00:00 1000 mg via ORAL

## 2019-06-01 MED ORDER — PROPOFOL 10 MG/ML IV BOLUS
INTRAVENOUS | Status: AC
  Filled 2019-06-01: qty 20

## 2019-06-01 MED ORDER — FAMOTIDINE 20 MG PO TABS: 20.0000 mg | ORAL_TABLET | Freq: Once | ORAL | Status: DC

## 2019-06-01 MED ORDER — GABAPENTIN 300 MG PO CAPS
300.0000 mg | ORAL_CAPSULE | ORAL | Status: AC
  Administered 2019-06-01: 300 mg via ORAL

## 2019-06-01 MED ORDER — FENTANYL CITRATE (PF) 100 MCG/2ML IJ SOLN
INTRAMUSCULAR | Status: DC | PRN
  Administered 2019-06-01 (×2): 50 ug via INTRAVENOUS

## 2019-06-01 MED ORDER — PROPOFOL 10 MG/ML IV BOLUS
INTRAVENOUS | Status: DC | PRN
  Administered 2019-06-01: 150 mg via INTRAVENOUS

## 2019-06-01 MED ORDER — PROMETHAZINE HCL 25 MG/ML IJ SOLN
INTRAMUSCULAR | Status: AC
  Administered 2019-06-01: 6.25 mg via INTRAVENOUS
  Filled 2019-06-01: qty 1

## 2019-06-01 MED ORDER — ACETAMINOPHEN 500 MG PO TABS
ORAL_TABLET | ORAL | Status: AC
  Administered 2019-06-01: 07:00:00 1000 mg via ORAL
  Filled 2019-06-01: qty 2

## 2019-06-01 MED ORDER — ROCURONIUM BROMIDE 100 MG/10ML IV SOLN
INTRAVENOUS | Status: DC | PRN
  Administered 2019-06-01: 40 mg via INTRAVENOUS

## 2019-06-01 MED ORDER — 0.9 % SODIUM CHLORIDE (POUR BTL) OPTIME
TOPICAL | Status: DC | PRN
  Administered 2019-06-01: 500 mL

## 2019-06-01 MED ORDER — MIDAZOLAM HCL 2 MG/2ML IJ SOLN
INTRAMUSCULAR | Status: DC | PRN
  Administered 2019-06-01: 2 mg via INTRAVENOUS

## 2019-06-01 MED ORDER — CEFAZOLIN SODIUM-DEXTROSE 2-4 GM/100ML-% IV SOLN
2.0000 g | INTRAVENOUS | Status: AC
  Administered 2019-06-01: 2 g via INTRAVENOUS

## 2019-06-01 MED ORDER — DEXAMETHASONE SODIUM PHOSPHATE 10 MG/ML IJ SOLN
INTRAMUSCULAR | Status: AC
  Filled 2019-06-01: qty 1

## 2019-06-01 SURGICAL SUPPLY — 44 items
APPLIER CLIP 5 13 M/L LIGAMAX5 (MISCELLANEOUS) ×2
BLADE SURG 15 STRL LF DISP TIS (BLADE) ×1 IMPLANT
BLADE SURG 15 STRL SS (BLADE) ×1
CANISTER SUCT 1200ML W/VALVE (MISCELLANEOUS) ×2 IMPLANT
CATH CHOLANGI 4FR 420404F (CATHETERS) IMPLANT
CHLORAPREP W/TINT 26 (MISCELLANEOUS) ×2 IMPLANT
CLIP APPLIE 5 13 M/L LIGAMAX5 (MISCELLANEOUS) ×1 IMPLANT
CONRAY 60ML FOR OR (MISCELLANEOUS) ×2 IMPLANT
COVER WAND RF STERILE (DRAPES) ×2 IMPLANT
DERMABOND ADVANCED (GAUZE/BANDAGES/DRESSINGS) ×1
DERMABOND ADVANCED .7 DNX12 (GAUZE/BANDAGES/DRESSINGS) ×1 IMPLANT
DRAPE C-ARM XRAY 36X54 (DRAPES) IMPLANT
ELECT REM PT RETURN 9FT ADLT (ELECTROSURGICAL) ×2
ELECTRODE REM PT RTRN 9FT ADLT (ELECTROSURGICAL) ×1 IMPLANT
GLOVE SURG SYN 7.0 (GLOVE) ×2 IMPLANT
GLOVE SURG SYN 7.0 PF PI (GLOVE) ×1 IMPLANT
GLOVE SURG SYN 7.5  E (GLOVE) ×1
GLOVE SURG SYN 7.5 E (GLOVE) ×1 IMPLANT
GLOVE SURG SYN 7.5 PF PI (GLOVE) ×1 IMPLANT
GOWN STRL REUS W/ TWL LRG LVL3 (GOWN DISPOSABLE) ×3 IMPLANT
GOWN STRL REUS W/TWL LRG LVL3 (GOWN DISPOSABLE) ×3
IRRIGATION STRYKERFLOW (MISCELLANEOUS) IMPLANT
IRRIGATOR STRYKERFLOW (MISCELLANEOUS)
IV CATH ANGIO 12GX3 LT BLUE (NEEDLE) ×2 IMPLANT
IV NS 1000ML (IV SOLUTION)
IV NS 1000ML BAXH (IV SOLUTION) IMPLANT
JACKSON PRATT 10 (INSTRUMENTS) IMPLANT
L-HOOK LAP DISP 36CM (ELECTROSURGICAL) ×2
LABEL OR SOLS (LABEL) ×2 IMPLANT
LHOOK LAP DISP 36CM (ELECTROSURGICAL) ×1 IMPLANT
NEEDLE HYPO 22GX1.5 SAFETY (NEEDLE) ×4 IMPLANT
PACK LAP CHOLECYSTECTOMY (MISCELLANEOUS) ×2 IMPLANT
PENCIL ELECTRO HAND CTR (MISCELLANEOUS) ×2 IMPLANT
POUCH SPECIMEN RETRIEVAL 10MM (ENDOMECHANICALS) ×2 IMPLANT
SCISSORS METZENBAUM CVD 33 (INSTRUMENTS) ×2 IMPLANT
SET TUBE SMOKE EVAC HIGH FLOW (TUBING) ×2 IMPLANT
SLEEVE ADV FIXATION 5X100MM (TROCAR) ×6 IMPLANT
SPONGE VERSALON 4X4 4PLY (MISCELLANEOUS) IMPLANT
SUT MNCRL 4-0 (SUTURE) ×1
SUT MNCRL 4-0 27XMFL (SUTURE) ×1
SUT VICRYL 0 AB UR-6 (SUTURE) ×2 IMPLANT
SUTURE MNCRL 4-0 27XMF (SUTURE) ×1 IMPLANT
TROCAR BALLN GELPORT 12X130M (ENDOMECHANICALS) ×2 IMPLANT
TROCAR Z-THREAD OPTICAL 5X100M (TROCAR) ×2 IMPLANT

## 2019-06-01 NOTE — Anesthesia Procedure Notes (Signed)
Procedure Name: Intubation Date/Time: 06/01/2019 9:11 AM Performed by: Philbert Riser, CRNA Pre-anesthesia Checklist: Patient identified, Emergency Drugs available, Suction available, Patient being monitored and Timeout performed Patient Re-evaluated:Patient Re-evaluated prior to induction Oxygen Delivery Method: Circle system utilized and Simple face mask Preoxygenation: Pre-oxygenation with 100% oxygen Induction Type: IV induction Ventilation: Mask ventilation without difficulty Laryngoscope Size: Mac and 3 Grade View: Grade I Tube type: Oral Tube size: 7.0 mm Number of attempts: 1 Airway Equipment and Method: Stylet Placement Confirmation: ETT inserted through vocal cords under direct vision,  positive ETCO2 and CO2 detector Secured at: 21 cm Tube secured with: Tape Dental Injury: Teeth and Oropharynx as per pre-operative assessment

## 2019-06-01 NOTE — Anesthesia Postprocedure Evaluation (Signed)
Anesthesia Post Note  Patient: [name]  Procedure(s) Performed: LAPAROSCOPIC CHOLECYSTECTOMY (N/A Abdomen)  Patient location during evaluation: PACU Anesthesia Type: General Level of consciousness: awake and alert Pain management: pain level controlled Vital Signs Assessment: post-procedure vital signs reviewed and stable Respiratory status: spontaneous breathing, nonlabored ventilation, respiratory function stable and patient connected to nasal cannula oxygen Cardiovascular status: blood pressure returned to baseline and stable Postop Assessment: no apparent nausea or vomiting Anesthetic complications: no     Last Vitals:  Vitals:   06/01/19 1105 06/01/19 1110  BP: 123/69   Pulse: 61 62  Resp: 19 16  Temp:    SpO2: 100% 100%    Last Pain:  Vitals:   06/01/19 1110  TempSrc:   PainSc: 5                  Martha Clan

## 2019-06-01 NOTE — H&P (Signed)
History of Present Illness: Kelly Hudson is a 23 y.o. female presenting for evaluation of biliary dyskinesia.  Patient reports she's been having problems with back pain, nausea, and bloatedness for the past 6-8 months.  She had a HIDA scan on 02/08/19 which showed biliary dyskinesia, with EF of 20% and patient being symptomatic after drinking Ensure.    Patient reports that her pain is mostly in the back between the scapula.  She did have more heartburn a while back but is taking an antiacid which has helped a lot with GERD type of symptoms.  She denies any fevers, chills, chest pain, shortness of breath.  She did have some mild nausea and pain about 3-4 days ago.    Past Medical History:     Past Medical History:  Diagnosis Date  . GERD Anxiety      Past Surgical History:      Past Surgical History:  Procedure Laterality Date  . NO PAST SURGERIES      Home Medications:        Prior to Admission medications   Medication Sig Start Date End Date Taking? Authorizing Provider  meloxicam (MOBIC) 7.5 MG tablet  01/11/19  Yes [provider]  pantoprazole (PROTONIX) 40 MG tablet TK 1 T PO QD 04/02/19  Yes [provider]  levonorgestrel-ethinyl estradiol (AVIANE) 0.1-20 MG-MCG tablet Take 1 tablet by mouth daily. 2/77/82   Copland, Deirdre Evener, PA-C    Allergies:      Allergies  Allergen Reactions  . Prednisone Other (See Comments)    Caused heartburn & chest discomfort    Social History:  reports that she has never smoked. She has never used smokeless tobacco. She reports current alcohol use. She reports that she does not use drugs.   Family History:  Patient states that other female family members have had issues with their gallbladders      Family History  Problem Relation Age of Onset  . Other Maternal Grandmother        brain tumor  . Diabetes Paternal Grandfather   . Cancer Neg Hx   . Hypertension Neg Hx     Review of  Systems: Review of Systems  Constitutional: Negative for chills and fever.  HENT: Negative for hearing loss.   Respiratory: Negative for shortness of breath.   Cardiovascular: Negative for chest pain.  Gastrointestinal: Positive for abdominal pain and nausea. Negative for constipation, diarrhea and vomiting.  Genitourinary: Negative for dysuria.  Musculoskeletal: Positive for back pain.  Skin: Negative for rash.  Neurological: Negative for dizziness.  Psychiatric/Behavioral: Negative for depression.    Physical Exam BP 115/76   Pulse 76   Temp 97.7 F (36.5 C) (Skin)   Ht 5\' 6"  (1.676 m)   Wt 153 lb (69.4 kg)   SpO2 99%   BMI 24.69 kg/m  CONSTITUTIONAL: No acute distress HEENT:  Normocephalic, atraumatic, extraocular motion intact. NECK: Trachea is midline, and there is no jugular venous distension.  RESPIRATORY:  Lungs are clear, and breath sounds are equal bilaterally. Normal respiratory effort without pathologic use of accessory muscles. CARDIOVASCULAR: Heart is regular without murmurs, gallops, or rubs. GI: The abdomen is soft, non-distended, with mild discomfort in epigastric and right upper quadrant areas.  Negative Murphy's sign.  MUSCULOSKELETAL:  Normal muscle strength and tone in all four extremities.  No peripheral edema or cyanosis. SKIN: Skin turgor is normal. There are no pathologic skin lesions.  NEUROLOGIC:  Motor and sensation is grossly normal.  Cranial nerves are grossly intact. PSYCH:  Alert and oriented to person, place and time. Affect is normal.  Laboratory Analysis: Lab Results Last 24 Hours  No results found for this or any previous visit (from the past 24 hour(s)).    Imaging: HIDA scan 02/08/19: IMPRESSION: Patent biliary tree.  Abnormal gallbladder response to fatty meal stimulation with a decreased gallbladder ejection fraction of 20%.  Abdominal cramping following Ensure.   Assessment and Plan: This is a 23 y.o. female with biliary  dyskinesia.    Discussed with her the role of laparoscopic cholecystectomy, including risks, possible complications, expected post-op recovery and restrictions.  Discussed with her the risks of bleeding, infection, and injury to surrounding structures.  Discussed the restriction of no heavy lifting or pushing of no more than 10-15 lbs for 4 weeks.  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     Henrene DodgeJose Tyrique Sporn, MD [facility]

## 2019-06-01 NOTE — Transfer of Care (Signed)
Immediate Anesthesia Transfer of Care Note  Patient: Kelly Hudson  Procedure(s) Performed: LAPAROSCOPIC CHOLECYSTECTOMY (N/A Abdomen)  Patient Location: PACU  Anesthesia Type:General  Level of Consciousness: awake, alert  and oriented  Airway & Oxygen Therapy: Patient Spontanous Breathing and Patient connected to face mask oxygen  Post-op Assessment: Report given to RN and Post -op Vital signs reviewed and stable  Post vital signs: Reviewed and stable  Last Vitals:  Vitals Value Taken Time  BP    Temp    Pulse 86 06/01/19 1033  Resp 22 06/01/19 1033  SpO2 100 % 06/01/19 1033  Vitals shown include unvalidated device data.  Last Pain:  Vitals:   06/01/19 0717  TempSrc: Tympanic  PainSc: 0-No pain         Complications: No apparent anesthesia complications

## 2019-06-01 NOTE — Anesthesia Preprocedure Evaluation (Signed)
Anesthesia Evaluation  Patient identified by MRN, date of birth, ID band Patient awake    Reviewed: Allergy & Precautions, H&P , NPO status , Patient's Chart, lab work & pertinent test results, reviewed documented beta blocker date and time   History of Anesthesia Complications Negative for: history of anesthetic complications  Airway Mallampati: I  TM Distance: >3 FB Neck ROM: full    Dental  (+) Dental Advidsory Given, Teeth Intact, Chipped   Pulmonary neg pulmonary ROS,           Cardiovascular Exercise Tolerance: Good negative cardio ROS       Neuro/Psych negative neurological ROS  negative psych ROS   GI/Hepatic Neg liver ROS, GERD  ,  Endo/Other  negative endocrine ROS  Renal/GU negative Renal ROS  negative genitourinary   Musculoskeletal   Abdominal   Peds  Hematology negative hematology ROS (+)   Anesthesia Other Findings Past Medical History: No date: Anxiety No date: GERD (gastroesophageal reflux disease) No date: No known health problems   Reproductive/Obstetrics negative OB ROS                             Anesthesia Physical Anesthesia Plan  ASA: II  Anesthesia Plan: General   Post-op Pain Management:    Induction: Intravenous  PONV Risk Score and Plan: 3 and Ondansetron, Dexamethasone, Midazolam, Promethazine and Treatment may vary due to age or medical condition  Airway Management Planned: Oral ETT  Additional Equipment:   Intra-op Plan:   Post-operative Plan: Extubation in OR  Informed Consent: I have reviewed the patients History and Physical, chart, labs and discussed the procedure including the risks, benefits and alternatives for the proposed anesthesia with the patient or authorized representative who has indicated his/her understanding and acceptance.     Dental Advisory Given  Plan Discussed with: Anesthesiologist, CRNA and  Surgeon  Anesthesia Plan Comments:         Anesthesia Quick Evaluation

## 2019-06-01 NOTE — Discharge Instructions (Signed)

## 2019-06-01 NOTE — Op Note (Signed)
  Procedure Date:  06/01/2019  Pre-operative Diagnosis:  Biliary Dyskinesia  Post-operative Diagnosis:  Biliary Dyskinesia  Procedure:  Laparoscopic cholecystectomy  Surgeon:  Melvyn Neth, MD  Anesthesia:  General endotracheal  Estimated Blood Loss:  5 ml  Specimens:  gallbladder  Complications:  None  Indications for Procedure:  This is a 23 y.o. female who presents with abdominal pain and workup revealing biliary dyskinesia.  The benefits, complications, treatment options, and expected outcomes were discussed with the patient. The risks of bleeding, infection, recurrence of symptoms, failure to resolve symptoms, bile duct damage, bile duct leak, retained common bile duct stone, bowel injury, and need for further procedures were all discussed with the patient and she was willing to proceed.  Description of Procedure: The patient was correctly identified in the preoperative area and brought into the operating room.  The patient was placed supine with VTE prophylaxis in place.  Appropriate time-outs were performed.  Anesthesia was induced and the patient was intubated.  Appropriate antibiotics were infused.  The abdomen was prepped and draped in a sterile fashion. An infraumbilical incision was made. A cutdown technique was used to enter the abdominal cavity without injury, and a Hasson trocar was inserted.  Pneumoperitoneum was obtained with appropriate opening pressures.  A 5-mm port was placed in the subxiphoid area and two 5-mm ports were placed in the right upper quadrant under direct visualization.  The gallbladder was identified.  The fundus was grasped and retracted cephalad.  Adhesions were lysed bluntly and with electrocautery. The infundibulum was grasped and retracted laterally, exposing the peritoneum overlying the gallbladder.  This was incised with electrocautery and extended on either side of the gallbladder.  The cystic duct and cystic artery were clearly identified and  bluntly dissected.  Both were clipped twice proximally and once distally, cutting in between.  The gallbladder was taken from the gallbladder fossa in a retrograde fashion with electrocautery. The gallbladder was placed in an Endocatch bag. The liver bed was inspected and any bleeding was controlled with electrocautery. The right upper quadrant was then inspected again revealing intact clips, no bleeding, and no ductal injury.    The 5 mm ports were removed under direct visualization and the Hasson trocar was removed.  The Endocatch bag was brought out via the umbilical incision. The fascial opening was closed using 0 vicryl suture.  Local anesthetic was infused in all incisions and the incisions were closed with 4-0 Monocryl.  The wounds were cleaned and sealed with DermaBond.  The patient was emerged from anesthesia and extubated and brought to the recovery room for further management.  The patient tolerated the procedure well and all counts were correct at the end of the case.   Melvyn Neth, MD

## 2019-06-01 NOTE — Anesthesia Post-op Follow-up Note (Signed)
Anesthesia QCDR form completed.        

## 2019-06-02 ENCOUNTER — Telehealth: Payer: Self-pay | Admitting: *Deleted

## 2019-06-02 LAB — SURGICAL PATHOLOGY

## 2019-06-02 NOTE — Telephone Encounter (Signed)
Patient states that she is having a lot of pain even with the Oxycodone. Patient notified that she may take Ibuprofen or Tylenol in addition to the Oxycodone. She may also use a heating pad for comfort. She will try this and call us back in a couple of days if she is not better.

## 2019-06-02 NOTE — Telephone Encounter (Signed)
Patient called and stated that she had surgery yesterday and she is currently taking oxycodone and its not helping with the pain

## 2019-06-16 ENCOUNTER — Telehealth (INDEPENDENT_AMBULATORY_CARE_PROVIDER_SITE_OTHER): Payer: 59 | Admitting: Surgery

## 2019-06-16 ENCOUNTER — Other Ambulatory Visit: Payer: Self-pay

## 2019-06-16 DIAGNOSIS — Z09 Encounter for follow-up examination after completed treatment for conditions other than malignant neoplasm: Secondary | ICD-10-CM

## 2019-06-16 DIAGNOSIS — K828 Other specified diseases of gallbladder: Secondary | ICD-10-CM

## 2019-06-18 NOTE — Progress Notes (Signed)
Virtual Visit via Telephone Note  I connected with Kelly Hudson on 06/18/19 at 10:45 AM EDT by telephone and verified that I am speaking with the correct person using two identifiers.  Location: Patient: Home Provider: Office   I discussed the limitations, risks, security and privacy concerns of performing an evaluation and management service by telephone and the availability of in person appointments. I also discussed with the patient that there may be a patient responsible charge related to this service. The patient expressed understanding and agreed to proceed.  This service was provided via telemedicine.  The patient consented to the visit being carried via telemedicine.  Referring Provider:  Eda Paschal, NP  People participating in this telemedicine visit:  Myself, patient  Time spent:  15 minutes   History of Present Illness: Kelly Hudson is a 23 y.o. female s/p laparoscopic cholecystectomy on 6/25 for biliary dyskinesia.  She reports that she's still having some epigastric pain, similar to what she was having preop.  Intermittently will also have nausea.  She stopped taking her PPI after surgery.   Observations/Objective: Patient does not appear to be in an acute distress and is able to carry a normal conversation without any shortness of breath.  Assessment and Plan: 23 yo female s/p laparoscopic cholecystectomy.  --Discussed with the patient that her symptoms could be related to her surgery, as her body is getting adjusted to not having a gallbladder. --Also discussed with the patient that her symptoms could also be related to GERD.  Now that she stopped her PPI, some of her symptoms are returning.  Recommended that she start taking her PPI again, once daily, scheduled.  --Discussed with her that if her symptoms do not improve over the next few weeks, to call us again for an in-office visit so we can evaluate her better.  Otherwise follow up prn.  Follow Up  Instructions:    I discussed the assessment and treatment plan with the patient. The patient was provided an opportunity to ask questions and all were answered. The patient agreed with the plan and demonstrated an understanding of the instructions.   The patient was advised to call back or seek an in-person evaluation if the symptoms worsen or if the condition fails to improve as anticipated.  I provided 15 minutes of non-face-to-face time during this encounter.   Olean Ree, MD

## 2019-06-19 ENCOUNTER — Telehealth: Payer: Self-pay | Admitting: *Deleted

## 2019-06-19 NOTE — Telephone Encounter (Signed)
Patient called and stated that she is still experiencing back pain, it kept her up all night, she is still taking oxycodone and was not able to return back to work today. She stated that Dr.Piscoya told her if she still is experiencing back pain that he may order a MRI, patient also wants to get a note for her work regarding her still being out.

## 2019-06-19 NOTE — Telephone Encounter (Signed)
Left message

## 2019-06-19 NOTE — Telephone Encounter (Signed)
Give patient a note to return back to work on 06/26/2019. Patient will call back and scheduled a appt if needed.

## 2019-06-29 ENCOUNTER — Telehealth: Payer: Self-pay | Admitting: *Deleted

## 2019-06-29 NOTE — Telephone Encounter (Signed)
Notified Dr Hampton Abbot and he is speaking with the patient.

## 2019-06-29 NOTE — Telephone Encounter (Signed)
Patient called and wanted to talk with a nurse, she had surgery on 06/01/19 by Dr.Piscoya and now she is having some bloating that started 2 days ago and not able to have a BM. Patient also complained of drainage at the incision site that is whiteish in color. Please call and advise

## 2019-06-30 ENCOUNTER — Ambulatory Visit (INDEPENDENT_AMBULATORY_CARE_PROVIDER_SITE_OTHER): Payer: 59 | Admitting: Surgery

## 2019-06-30 ENCOUNTER — Encounter: Payer: Self-pay | Admitting: Surgery

## 2019-06-30 ENCOUNTER — Other Ambulatory Visit: Payer: Self-pay

## 2019-06-30 VITALS — BP 112/68 | HR 67 | Temp 97.9°F | Resp 16 | Ht 65.0 in | Wt 156.0 lb

## 2019-06-30 DIAGNOSIS — Z09 Encounter for follow-up examination after completed treatment for conditions other than malignant neoplasm: Secondary | ICD-10-CM

## 2019-06-30 NOTE — Patient Instructions (Addendum)
We will not order an Ultrasound at this time, however if your symptoms do not improve let us know and we can order the Right Upper quadrant ultrasound.   GENERAL POST-OPERATIVE PATIENT INSTRUCTIONS   WOUND CARE INSTRUCTIONS:  Keep a dry clean dressing on the wound if there is drainage. The initial bandage may be removed after 24 hours.  Once the wound has quit draining you may leave it open to air.  If clothing rubs against the wound or causes irritation and the wound is not draining you may cover it with a dry dressing during the daytime.  Try to keep the wound dry and avoid ointments on the wound unless directed to do so.  If the wound becomes bright red and painful or starts to drain infected material that is not clear, please contact your physician immediately.  If the wound is mildly pink and has a thick firm ridge underneath it, this is normal, and is referred to as a healing ridge.  This will resolve over the next 4-6 weeks.  BATHING: You may shower if you have been informed of this by your surgeon. However, Please do not submerge in a tub, hot tub, or pool until incisions are completely sealed or have been told by your surgeon that you may do so.  DIET:  You may eat any foods that you can tolerate.  It is a good idea to eat a high fiber diet and take in plenty of fluids to prevent constipation.  If you do become constipated you may want to take a mild laxative or take ducolax tablets on a daily basis until your bowel habits are regular.  Constipation can be very uncomfortable, along with straining, after recent surgery.  ACTIVITY:  You are encouraged to cough and deep breath or use your incentive spirometer if you were given one, every 15-30 minutes when awake.  This will help prevent respiratory complications and low grade fevers post-operatively if you had a general anesthetic.  You may want to hug a pillow when coughing and sneezing to add additional support to the surgical area, if you had  abdominal or chest surgery, which will decrease pain during these times.  You are encouraged to walk and engage in light activity for the next two weeks.  You should not lift more than 20 pounds, until 07/13/2019 as it could put you at increased risk for complications.  Twenty pounds is roughly equivalent to a plastic bag of groceries. At that time- Listen to your body when lifting, if you have pain when lifting, stop and then try again in a few days. Soreness after doing exercises or activities of daily living is normal as you get back in to your normal routine.  MEDICATIONS:  Try to take narcotic medications and anti-inflammatory medications, such as tylenol, ibuprofen, naprosyn, etc., with food.  This will minimize stomach upset from the medication.  Should you develop nausea and vomiting from the pain medication, or develop a rash, please discontinue the medication and contact your physician.  You should not drive, make important decisions, or operate machinery when taking narcotic pain medication.  SUNBLOCK Use sun block to incision area over the next year if this area will be exposed to sun. This helps decrease scarring and will allow you avoid a permanent darkened area over your incision.  QUESTIONS:  Please feel free to call our office if you have any questions, and we will be glad to assist you. [phone]

## 2019-07-03 NOTE — Progress Notes (Signed)
S/p lap chole by Dr. Hampton Abbot 6/25 Path d/w pt Doing well Taking some po C/o some bloating No fevers She is anxious  PE NAD Abd: incisions c/d/i no infection or peritonitis  A/P Overall doing very well She is anxious  F/U prn

## 2020-04-23 ENCOUNTER — Other Ambulatory Visit: Payer: Self-pay

## 2020-04-23 ENCOUNTER — Encounter: Payer: Self-pay | Admitting: Obstetrics and Gynecology

## 2020-04-23 ENCOUNTER — Ambulatory Visit (INDEPENDENT_AMBULATORY_CARE_PROVIDER_SITE_OTHER): Payer: 59 | Admitting: Obstetrics and Gynecology

## 2020-04-23 ENCOUNTER — Ambulatory Visit: Payer: Self-pay | Admitting: Obstetrics and Gynecology

## 2020-04-23 ENCOUNTER — Other Ambulatory Visit (HOSPITAL_COMMUNITY)
Admission: RE | Admit: 2020-04-23 | Discharge: 2020-04-23 | Disposition: A | Discharge status: Home / Self Care | Payer: 59 | Source: Ambulatory Visit | Attending: Obstetrics and Gynecology | Admitting: Obstetrics and Gynecology

## 2020-04-23 VITALS — BP 100/60 | Ht 66.0 in | Wt 159.0 lb

## 2020-04-23 DIAGNOSIS — Z3046 Encounter for surveillance of implantable subdermal contraceptive: Secondary | ICD-10-CM | POA: Diagnosis not present

## 2020-04-23 DIAGNOSIS — Z01419 Encounter for gynecological examination (general) (routine) without abnormal findings: Secondary | ICD-10-CM

## 2020-04-23 DIAGNOSIS — Z23 Encounter for immunization: Secondary | ICD-10-CM

## 2020-04-23 DIAGNOSIS — Z124 Encounter for screening for malignant neoplasm of cervix: Secondary | ICD-10-CM | POA: Insufficient documentation

## 2020-04-23 NOTE — Addendum Note (Signed)
Addended by: Donnetta Hail on: 04/23/2020 03:44 PM   Modules accepted: Orders

## 2020-04-23 NOTE — Patient Instructions (Signed)
I value your feedback and entrusting us with your care. If you get a [facility] patient survey, I would appreciate you taking the time to let us know about your experience today. Thank you!  As of November 16, 2019, your lab results will be released to your MyChart immediately, before I even have a chance to see them. Please give me time to review them and contact you if there are any abnormalities. Thank you for your patience.  

## 2020-04-23 NOTE — Progress Notes (Signed)
Chief Complaint  Patient presents with  . Gynecologic Exam     HPI:      [name] is a 24 y.o. [id number] who LMP was Patient's last menstrual period was 04/18/2020 (approximate)., presents today for her annual examination.  Her menses are irregular with nexplanon.  Dysmenorrhea none.  Sex activity: single partner, contraception - nexplanon placed about a yr ago per pt report Last Pap: 08/02/17 Results were: no abnormalities  Hx of STDs: none  There is no FH of breast cancer. There is no FH of ovarian cancer. The patient does not do self-breast exams.  Tobacco use: The patient denies current or previous tobacco use. Alcohol use: social drinker No drug use.  Exercise: moderately active  She does not get adequate calcium but not Vitamin D in her diet.  Gardasil not done. Pt interested today.  Past Medical History:  Diagnosis Date  . Anxiety   . GERD (gastroesophageal reflux disease)   . No known health problems     Past Surgical History:  Procedure Laterality Date  . CHOLECYSTECTOMY N/A 06/01/2019   Procedure: LAPAROSCOPIC CHOLECYSTECTOMY;  Surgeon: Olean Ree, MD;  Location: [facility] ORS;  Service: General;  Laterality: N/A;  . INDUCED ABORTION    . NO PAST SURGERIES      Family History  Problem Relation Age of Onset  . Other Maternal Grandmother        brain tumor  . Diabetes Paternal Grandfather   . Cancer Neg Hx   . Hypertension Neg Hx     Social History   Socioeconomic History  . Marital status: Single    Spouse name: Not on file  . Number of children: Not on file  . Years of education: Not on file  . Highest education level: Not on file  Occupational History  . Not on file  Tobacco Use  . Smoking status: Never Smoker  . Smokeless tobacco: Never Used  Substance and Sexual Activity  . Alcohol use: Yes    Comment: occasional  . Drug use: No  . Sexual activity: Yes    Birth control/protection: Implant  Other Topics Concern  . Not on  file  Social History Narrative  . Not on file   Social Determinants of Health   Financial Resource Strain:   . Difficulty of Paying Living Expenses:   Food Insecurity:   . Worried About Charity fundraiser in the Last Year:   . Arboriculturist in the Last Year:   Transportation Needs:   . Film/video editor (Medical):   Marland Kitchen Lack of Transportation (Non-Medical):   Physical Activity:   . Days of Exercise per Week:   . Minutes of Exercise per Session:   Stress:   . Feeling of Stress :   Social Connections:   . Frequency of Communication with Friends and Family:   . Frequency of Social Gatherings with Friends and Family:   . Attends Religious Services:   . Active Member of Clubs or Organizations:   . Attends Archivist Meetings:   Marland Kitchen Marital Status:   Intimate Partner Violence:   . Fear of Current or Ex-Partner:   . Emotionally Abused:   Marland Kitchen Physically Abused:   . Sexually Abused:     Current Meds  Medication Sig  . etonogestrel (NEXPLANON) 68 MG IMPL implant 1 each by Subdermal route once.     ROS:  Review of Systems  Constitutional: Negative for fatigue, fever and  unexpected weight change.  Respiratory: Negative for cough, shortness of breath and wheezing.   Cardiovascular: Negative for chest pain, palpitations and leg swelling.  Gastrointestinal: Negative for blood in stool, constipation, diarrhea, nausea and vomiting.  Endocrine: Negative for cold intolerance, heat intolerance and polyuria.  Genitourinary: Negative for dyspareunia, dysuria, flank pain, frequency, genital sores, hematuria, menstrual problem, pelvic pain, urgency, vaginal bleeding, vaginal discharge and vaginal pain.  Musculoskeletal: Negative for back pain, joint swelling and myalgias.  Skin: Negative for rash.  Neurological: Negative for dizziness, syncope, light-headedness, numbness and headaches.  Hematological: Negative for adenopathy.  Psychiatric/Behavioral: Negative for agitation,  confusion, sleep disturbance and suicidal ideas. The patient is not nervous/anxious.      Objective: BP 100/60   Ht 5\' 6"  (1.676 m)   Wt 159 lb (72.1 kg)   LMP 04/18/2020 (Approximate)   BMI 25.66 kg/m    Physical Exam Constitutional:      Appearance: She is well-developed.  Genitourinary:     Vulva, vagina, uterus, right adnexa and left adnexa normal.     No vulval lesion or tenderness noted.     No vaginal discharge, erythema or tenderness.     No cervical motion tenderness or polyp.     Uterus is not enlarged or tender.     No right or left adnexal mass present.     Right adnexa not tender.     Left adnexa not tender.     Genitourinary Comments: ERYTHEMA AT INTROITUS   Neck:     Thyroid: No thyromegaly.  Cardiovascular:     Rate and Rhythm: Normal rate and regular rhythm.     Heart sounds: Normal heart sounds. No murmur.  Pulmonary:     Effort: Pulmonary effort is normal.     Breath sounds: Normal breath sounds.  Chest:     Breasts:        Right: No mass, nipple discharge, skin change or tenderness.        Left: No mass, nipple discharge, skin change or tenderness.  Abdominal:     Palpations: Abdomen is soft.     Tenderness: There is no abdominal tenderness. There is no guarding.  Musculoskeletal:        General: Normal range of motion.     Cervical back: Normal range of motion.  Neurological:     General: No focal deficit present.     Mental Status: She is alert and oriented to person, place, and time.     Cranial Nerves: No cranial nerve deficit.  Skin:    General: Skin is warm and dry.  Psychiatric:        Mood and Affect: Mood normal.        Behavior: Behavior normal.        Thought Content: Thought content normal.        Judgment: Judgment normal.  Vitals reviewed.     Assessment/Plan: Encounter for annual routine gynecological examination  Cervical cancer screening - Plan: Cytology - PAP  Encounter for surveillance of implantable subdermal  contraceptive--due for removal in 2023 per pt report.  Gardasil #1 today.          GYN counsel STD prevention, adequate intake of calcium and vitamin D     F/U  Return in about 1 year (around 04/23/2021)./ 2 mos Gard #2  Jaran Sainz B. Gradie Butrick, PA-C 04/23/2020 3:27 PM

## 2020-04-25 LAB — CYTOLOGY - PAP: Diagnosis: NEGATIVE
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
## 2020-06-21 ENCOUNTER — Ambulatory Visit: Payer: 59

## 2020-07-24 ENCOUNTER — Ambulatory Visit (INDEPENDENT_AMBULATORY_CARE_PROVIDER_SITE_OTHER): Payer: 59

## 2020-07-24 ENCOUNTER — Other Ambulatory Visit: Payer: Self-pay

## 2020-07-24 DIAGNOSIS — Z23 Encounter for immunization: Secondary | ICD-10-CM

## 2020-07-24 NOTE — Patient Instructions (Signed)
Pt here today to receive her second Gardasil injection. It was given in R deltoid and she tolerated it well. Gardasil #3 in 4 months

## 2020-11-22 ENCOUNTER — Ambulatory Visit: Payer: 59

## 2021-06-11 DIAGNOSIS — E7800 Pure hypercholesterolemia, unspecified: Secondary | ICD-10-CM | POA: Diagnosis not present

## 2021-06-11 DIAGNOSIS — E559 Vitamin D deficiency, unspecified: Secondary | ICD-10-CM | POA: Diagnosis not present

## 2021-06-11 DIAGNOSIS — R110 Nausea: Secondary | ICD-10-CM | POA: Diagnosis not present

## 2021-06-11 DIAGNOSIS — R1010 Upper abdominal pain, unspecified: Secondary | ICD-10-CM | POA: Diagnosis not present

## 2021-06-11 DIAGNOSIS — E039 Hypothyroidism, unspecified: Secondary | ICD-10-CM | POA: Diagnosis not present

## 2021-06-11 DIAGNOSIS — R197 Diarrhea, unspecified: Secondary | ICD-10-CM | POA: Diagnosis not present

## 2021-06-11 DIAGNOSIS — R12 Heartburn: Secondary | ICD-10-CM | POA: Diagnosis not present

## 2021-06-12 DIAGNOSIS — L272 Dermatitis due to ingested food: Secondary | ICD-10-CM | POA: Diagnosis not present

## 2021-06-12 DIAGNOSIS — T781XXA Other adverse food reactions, not elsewhere classified, initial encounter: Secondary | ICD-10-CM | POA: Diagnosis not present

## 2021-06-17 DIAGNOSIS — R140 Abdominal distension (gaseous): Secondary | ICD-10-CM | POA: Diagnosis not present

## 2021-06-17 DIAGNOSIS — R1010 Upper abdominal pain, unspecified: Secondary | ICD-10-CM | POA: Diagnosis not present

## 2021-06-17 DIAGNOSIS — R141 Gas pain: Secondary | ICD-10-CM | POA: Diagnosis not present

## 2021-07-15 DIAGNOSIS — R197 Diarrhea, unspecified: Secondary | ICD-10-CM | POA: Diagnosis not present

## 2021-07-15 DIAGNOSIS — B9681 Helicobacter pylori [H. pylori] as the cause of diseases classified elsewhere: Secondary | ICD-10-CM | POA: Diagnosis not present

## 2021-07-15 DIAGNOSIS — R1010 Upper abdominal pain, unspecified: Secondary | ICD-10-CM | POA: Diagnosis not present

## 2021-08-07 DIAGNOSIS — Z1389 Encounter for screening for other disorder: Secondary | ICD-10-CM | POA: Diagnosis not present

## 2021-08-07 DIAGNOSIS — B9681 Helicobacter pylori [H. pylori] as the cause of diseases classified elsewhere: Secondary | ICD-10-CM | POA: Diagnosis not present

## 2021-08-07 DIAGNOSIS — B370 Candidal stomatitis: Secondary | ICD-10-CM | POA: Diagnosis not present

## 2021-08-07 DIAGNOSIS — K2990 Gastroduodenitis, unspecified, without bleeding: Secondary | ICD-10-CM | POA: Diagnosis not present

## 2021-11-18 ENCOUNTER — Ambulatory Visit: Payer: Self-pay | Admitting: Obstetrics and Gynecology

## 2021-12-10 NOTE — Progress Notes (Deleted)
No chief complaint on file.    HPI:      Kelly Hudson is a 26 y.o. [id number] who LMP was No LMP recorded. Patient has had an implant., presents today for her annual examination.  Her menses are irregular with nexplanon.  Dysmenorrhea none.  Sex activity: single partner, contraception - nexplanon placed about 5/20 per pt report Last Pap: 5/18/21Results were: no abnormalities  Hx of STDs: none  There is no FH of breast cancer. There is no FH of ovarian cancer. The patient does not do self-breast exams.  Tobacco use: The patient denies current or previous tobacco use. Alcohol use: social drinker No drug use.  Exercise: moderately active  She does not get adequate calcium but not Vitamin D in her diet.  Gardasil not done. Pt interested today.  Past Medical History:  Diagnosis Date   Anxiety    GERD (gastroesophageal reflux disease)    No known health problems     Past Surgical History:  Procedure Laterality Date   CHOLECYSTECTOMY N/A 06/01/2019   Procedure: LAPAROSCOPIC CHOLECYSTECTOMY;  Surgeon: Olean Ree, MD;  Location: [facility] ORS;  Service: General;  Laterality: N/A;   INDUCED ABORTION     NO PAST SURGERIES      Family History  Problem Relation Age of Onset   Other Maternal Grandmother        brain tumor   Diabetes Paternal Grandfather    Cancer Neg Hx    Hypertension Neg Hx     Social History   Socioeconomic History   Marital status: Single    Spouse name: Not on file   Number of children: Not on file   Years of education: Not on file   Highest education level: Not on file  Occupational History   Not on file  Tobacco Use   Smoking status: Never   Smokeless tobacco: Never  Vaping Use   Vaping Use: Never used  Substance and Sexual Activity   Alcohol use: Yes    Comment: occasional   Drug use: No   Sexual activity: Yes    Birth control/protection: Implant  Other Topics Concern   Not on file  Social History Narrative   Not on file    Social Determinants of Health   Financial Resource Strain: Not on file  Food Insecurity: Not on file  Transportation Needs: Not on file  Physical Activity: Not on file  Stress: Not on file  Social Connections: Not on file  Intimate Partner Violence: Not on file    No outpatient medications have been marked as taking for the 12/11/21 encounter (Appointment) with Rielynn Trulson, Deirdre Evener, PA-C.     ROS:  Review of Systems  Constitutional:  Negative for fatigue, fever and unexpected weight change.  Respiratory:  Negative for cough, shortness of breath and wheezing.   Cardiovascular:  Negative for chest pain, palpitations and leg swelling.  Gastrointestinal:  Negative for blood in stool, constipation, diarrhea, nausea and vomiting.  Endocrine: Negative for cold intolerance, heat intolerance and polyuria.  Genitourinary:  Negative for dyspareunia, dysuria, flank pain, frequency, genital sores, hematuria, menstrual problem, pelvic pain, urgency, vaginal bleeding, vaginal discharge and vaginal pain.  Musculoskeletal:  Negative for back pain, joint swelling and myalgias.  Skin:  Negative for rash.  Neurological:  Negative for dizziness, syncope, light-headedness, numbness and headaches.  Hematological:  Negative for adenopathy.  Psychiatric/Behavioral:  Negative for agitation, confusion, sleep disturbance and suicidal ideas. The patient is not nervous/anxious.  Objective: There were no vitals taken for this visit.   Physical Exam Constitutional:      Appearance: She is well-developed.  Genitourinary:     Vulva normal.     Genitourinary Comments: ERYTHEMA AT INTROITUS      No vaginal discharge, erythema or tenderness.      Right Adnexa: not tender and no mass present.    Left Adnexa: not tender and no mass present.    No cervical motion tenderness or polyp.     Uterus is not enlarged or tender.  Breasts:    Right: No mass, nipple discharge, skin change or tenderness.     Left:  No mass, nipple discharge, skin change or tenderness.  Neck:     Thyroid: No thyromegaly.  Cardiovascular:     Rate and Rhythm: Normal rate and regular rhythm.     Heart sounds: Normal heart sounds. No murmur heard. Pulmonary:     Effort: Pulmonary effort is normal.     Breath sounds: Normal breath sounds.  Abdominal:     Palpations: Abdomen is soft.     Tenderness: There is no abdominal tenderness. There is no guarding.  Musculoskeletal:        General: Normal range of motion.     Cervical back: Normal range of motion.  Neurological:     General: No focal deficit present.     Mental Status: She is alert and oriented to person, place, and time.     Cranial Nerves: No cranial nerve deficit.  Skin:    General: Skin is warm and dry.  Psychiatric:        Mood and Affect: Mood normal.        Behavior: Behavior normal.        Thought Content: Thought content normal.        Judgment: Judgment normal.  Vitals reviewed.    Assessment/Plan: Encounter for annual routine gynecological examination  Cervical cancer screening - Plan: Cytology - PAP  Encounter for surveillance of implantable subdermal contraceptive--due for removal in 2023 per pt report.  Gardasil #1 today.          GYN counsel STD prevention, adequate intake of calcium and vitamin D     F/U  No follow-ups on file./ 2 mos Gard #2  Daman Steffenhagen B. Tenley Winward, PA-C 12/10/2021 4:10 PM

## 2021-12-11 ENCOUNTER — Ambulatory Visit: Payer: Medicaid Other | Admitting: Obstetrics and Gynecology

## 2021-12-11 ENCOUNTER — Other Ambulatory Visit: Payer: Self-pay

## 2021-12-11 DIAGNOSIS — Z3046 Encounter for surveillance of implantable subdermal contraceptive: Secondary | ICD-10-CM

## 2021-12-11 DIAGNOSIS — Z01419 Encounter for gynecological examination (general) (routine) without abnormal findings: Secondary | ICD-10-CM

## 2021-12-18 DIAGNOSIS — F329 Major depressive disorder, single episode, unspecified: Secondary | ICD-10-CM | POA: Diagnosis not present

## 2021-12-18 DIAGNOSIS — E559 Vitamin D deficiency, unspecified: Secondary | ICD-10-CM | POA: Diagnosis not present

## 2021-12-18 DIAGNOSIS — K2990 Gastroduodenitis, unspecified, without bleeding: Secondary | ICD-10-CM | POA: Diagnosis not present

## 2021-12-18 DIAGNOSIS — E039 Hypothyroidism, unspecified: Secondary | ICD-10-CM | POA: Diagnosis not present

## 2021-12-18 DIAGNOSIS — Z3046 Encounter for surveillance of implantable subdermal contraceptive: Secondary | ICD-10-CM | POA: Diagnosis not present

## 2021-12-18 DIAGNOSIS — F411 Generalized anxiety disorder: Secondary | ICD-10-CM | POA: Diagnosis not present

## 2021-12-18 DIAGNOSIS — E7800 Pure hypercholesterolemia, unspecified: Secondary | ICD-10-CM | POA: Diagnosis not present

## 2021-12-18 DIAGNOSIS — M542 Cervicalgia: Secondary | ICD-10-CM | POA: Diagnosis not present

## 2021-12-18 DIAGNOSIS — R5381 Other malaise: Secondary | ICD-10-CM | POA: Diagnosis not present

## 2024-05-29 ENCOUNTER — Other Ambulatory Visit: Payer: Self-pay

## 2024-05-29 ENCOUNTER — Emergency Department
Admission: EM | Admit: 2024-05-29 | Discharge: 2024-05-29 | Disposition: A | Discharge status: Home / Self Care | Payer: Self-pay

## 2024-05-29 ENCOUNTER — Emergency Department: Payer: Self-pay

## 2024-05-29 DIAGNOSIS — R1033 Periumbilical pain: Secondary | ICD-10-CM

## 2024-05-29 DIAGNOSIS — N83201 Unspecified ovarian cyst, right side: Secondary | ICD-10-CM | POA: Insufficient documentation

## 2024-05-29 LAB — COMPREHENSIVE METABOLIC PANEL WITH GFR
ALT: 11 U/L (ref 0–44)
AST: 15 U/L (ref 15–41)
Albumin: 4.2 g/dL (ref 3.5–5.0)
Alkaline Phosphatase: 46 U/L (ref 38–126)
Anion gap: 7 (ref 5–15)
BUN: 11 mg/dL (ref 6–20)
CO2: 25 mmol/L (ref 22–32)
Calcium: 9.3 mg/dL (ref 8.9–10.3)
Chloride: 106 mmol/L (ref 98–111)
Creatinine, Ser: 0.52 mg/dL (ref 0.44–1.00)
GFR, Estimated: >60 mL/min (ref >60)
Glucose, Bld: 87 mg/dL (ref 70–99)
Potassium: 4 mmol/L (ref 3.5–5.1)
Sodium: 138 mmol/L (ref 135–145)
Total Bilirubin: 0.7 mg/dL (ref 0.0–1.2)
Total Protein: 7.3 g/dL (ref 6.5–8.1)

## 2024-05-29 LAB — URINALYSIS, ROUTINE W REFLEX MICROSCOPIC
Bilirubin Urine: NEGATIVE
Glucose, UA: NEGATIVE mg/dL
Hgb urine dipstick: NEGATIVE
Ketones, ur: NEGATIVE mg/dL
Nitrite: NEGATIVE
Protein, ur: NEGATIVE mg/dL
Specific Gravity, Urine: 1.006 (ref 1.005–1.030)
pH: 8 (ref 5.0–8.0)

## 2024-05-29 LAB — LIPASE, BLOOD: Lipase: 39 U/L (ref 11–51)

## 2024-05-29 LAB — CBC
HCT: 39.1 % (ref 36.0–46.0)
Hemoglobin: 13 g/dL (ref 12.0–15.0)
MCH: 28.9 pg (ref 26.0–34.0)
MCHC: 33.2 g/dL (ref 30.0–36.0)
MCV: 86.9 fL (ref 80.0–100.0)
Platelets: 211 10*3/uL (ref 150–400)
RBC: 4.5 MIL/uL (ref 3.87–5.11)
RDW: 12.5 % (ref 11.5–15.5)
WBC: 8.2 10*3/uL (ref 4.0–10.5)
nRBC: 0 % (ref 0.0–0.2)

## 2024-05-29 LAB — POC URINE PREG, ED: Preg Test, Ur: NEGATIVE

## 2024-05-29 MED ORDER — IOHEXOL 300 MG/ML  SOLN
100.0000 mL | Freq: Once | INTRAMUSCULAR | Status: AC | PRN
  Administered 2024-05-29: 100 mL via INTRAVENOUS

## 2024-05-29 NOTE — ED Provider Notes (Signed)
 [facility] Provider Note    Event Date/Time   First MD Initiated Contact with Patient 05/29/24 1204     (approximate)   History   Abdominal Pain  Pt here from [facility] clinic for concerns of r/o appy, pt presents with [employer] coffee, pt educated to not eat or drink anything until the MD sees her.   Pt to ED from [facility] for c/o dull abd pain that began Friday. Pt states no pain at this time unless she presses on the area above her umbilical. Pt states MD concerned about appendix.   HPI Kelly Hudson is a 28 y.o. female PMH prior cholecystectomy presents for evaluation of abdominal pain - Patient has had 3 days of periumbilical pain with some intermittent radiation of pain down to her right lower quadrant - No vaginal bleeding or discharge, no urinary symptoms - Last bowel movement yesterday, did have to strain and was quite firm - No fevers, vomiting, diarrhea - Was having more pain this morning but is currently asymptomatic  Per chart review, seen in clinic today noted to have right lower quadrant pain, sent to ED for imaging.     Physical Exam   Triage Vital Signs: ED Triage Vitals  Encounter Vitals Group     BP 05/29/24 1026 127/89     Girls Systolic BP Percentile --      Girls Diastolic BP Percentile --      Boys Systolic BP Percentile --      Boys Diastolic BP Percentile --      Pulse Rate 05/29/24 1026 71     Resp 05/29/24 1026 16     Temp 05/29/24 1026 97.9 F (36.6 C)     Temp Source 05/29/24 1026 Oral     SpO2 05/29/24 1026 100 %     Weight 05/29/24 1028 166 lb (75.3 kg)     Height 05/29/24 1028 5' 5 (1.651 m)     Head Circumference --      Peak Flow --      Pain Score 05/29/24 1027 0     Pain Loc --      Pain Education --      Exclude from Growth Chart --     Most recent vital signs: Vitals:   05/29/24 1026 05/29/24 1409  BP: 127/89 125/88  Pulse: 71 72  Resp: 16 16  Temp: 97.9 F (36.6 C) 98 F (36.7 C)  SpO2: 100%  100%     General: Awake, no distress.  Very well-appearing. CV:  Good peripheral perfusion. RRR, RP 2+ Resp:  Normal effort. CTAB Abd:  No distention.  Very mild intermittent tenderness to palpation in right lower quadrant only. No CVAT.    ED Results / Procedures / Treatments   Labs (all labs ordered are listed, but only abnormal results are displayed) Labs Reviewed  URINALYSIS, ROUTINE W REFLEX MICROSCOPIC - Abnormal; Notable for the following components:      Result Value   Color, Urine YELLOW (*)    APPearance HAZY (*)    Leukocytes,Ua TRACE (*)    Bacteria, UA MANY (*)    All other components within normal limits  LIPASE, BLOOD  COMPREHENSIVE METABOLIC PANEL WITH GFR  CBC  POC URINE PREG, ED     EKG  N/a   RADIOLOGY Radiology interpreted myself and radiology report.  No obvious pathology.  Does have right ovarian cyst, 4.6 cm.    PROCEDURES:  Critical Care performed: No  Procedures   MEDICATIONS ORDERED IN ED: Medications  iohexol (OMNIPAQUE) 300 MG/ML solution 100 mL (100 mLs Intravenous Contrast Given 05/29/24 1414)     IMPRESSION / MDM / ASSESSMENT AND PLAN / ED COURSE  I reviewed the triage vital signs and the nursing notes.                              DDX/MDM/AP: Differential diagnosis includes, but is not limited to, pain secondary to constipation, considered but doubt appendicitis, doubt atypical diverticulitis with very reassuring abdominal exam here.  Labs collected in triage already showed no evidence of urinary tract infection and no hematuria to suggest urolithiasis.  Discussed with patient that my exam is very reassuring and her initial lab work is also quite reassuring, highly doubt appendicitis though given that she has had periumbilical abdominal pain that reportedly radiated to right lower quadrant and her physician eval this morning was apparently more concerning, it is not unreasonable to pursue CT imaging if she would like to, also  offered treatment of constipation with plan for monitoring symptoms and close follow-up--patient would like to pursue CT.  Plan: - Labs from triage reviewed, unremarkable - N.p.o. - CT abdomen pelvis next-patient declines need for pain medications  Patient's presentation is most consistent with acute complicated illness / injury requiring diagnostic workup.    ED course below.  Workup unremarkable.  CT did show right ovarian cyst, 4.6 cm.  Patient continues to have no significant pain here, no clinical concern for torsion or hemorrhagic cyst at this time.  Also suspect constipation may be contributing to presentation, declined MiraLAX, counseled on oral hydration.  Recommend Tylenol , Motrin  as needed.  Very well-appearing here, currently asymptomatic, will proceed with discharge home.  Plan for PMD follow-up.  ED return precautions in place.  Patient was with plan.  Clinical Course as of 05/29/24 1532  Mon May 29, 2024  1211 hCG negative Urinalysis with no clear evidence of infection CBC, CMP reviewed, unremarkable [MM]  1502 CTAP: IMPRESSION: 1. 4.6 cm right ovarian cyst. No follow-up imaging recommended. Note: This recommendation does not apply to premenarchal patients and to those with increased risk (genetic, family history, elevated tumor markers or other high-risk factors) of ovarian cancer. Reference: JACR 2020 Feb; 17(2):248-254 2. Trace pelvic free fluid likely physiologic. 3. Normal appendix. 4. No other acute intra-abdominal or pelvic finding by CT.   [MM]    Clinical Course User Index [MM] Clarine Ozell LABOR, MD     FINAL CLINICAL IMPRESSION(S) / ED DIAGNOSES   Final diagnoses:  Periumbilical abdominal pain  Right ovarian cyst     Rx / DC Orders   ED Discharge Orders     None        Note:  This document was prepared using Dragon voice recognition software and may include unintentional dictation errors.   Clarine Ozell LABOR, MD 05/29/24 [phone]

## 2024-05-29 NOTE — ED Triage Notes (Signed)
 Pt to ED from [facility] for c/o dull abd pain that began Friday. Pt states no pain at this time unless she presses on the area above her umbilical. Pt states MD concerned about appendix.

## 2024-05-29 NOTE — Discharge Instructions (Addendum)
 Your evaluation in the emergency department was overall reassuring.  Your right lower abdominal pain may be due to a ovarian cyst in that area, but we see no evidence of any complications of this today.  I wonder if constipation may be contributing to your abdominal discomfort as well, and I recommend drinking plenty of water.  You can also try over-the-counter laxatives for this.  You can use Tylenol  and Motrin  as needed for any ongoing mild discomfort in your abdomen.  Please do follow-up with your primary care provider for any ongoing symptoms, and return to the emergency department with any new or worsening symptoms.

## 2024-05-29 NOTE — ED Triage Notes (Addendum)
 Pt here from [facility] clinic for concerns of r/o appy, pt presents with [employer] coffee, pt educated to not eat or drink anything until the MD sees her.

## 2024-08-08 ENCOUNTER — Ambulatory Visit: Payer: Self-pay

## 2024-08-08 VITALS — BP 115/65 | HR 73 | Ht 66.0 in | Wt 164.2 lb

## 2024-08-08 DIAGNOSIS — Z3049 Encounter for surveillance of other contraceptives: Secondary | ICD-10-CM | POA: Diagnosis not present

## 2024-08-08 DIAGNOSIS — Z3009 Encounter for other general counseling and advice on contraception: Secondary | ICD-10-CM | POA: Diagnosis not present

## 2024-08-08 DIAGNOSIS — Z3046 Encounter for surveillance of implantable subdermal contraceptive: Secondary | ICD-10-CM

## 2024-08-08 NOTE — Progress Notes (Addendum)
 Pt is here for PE and Nexplanon removal. Nexplanon removed successfully from the left arm by Damien Satchel, [facility]. And Pt tolerated well to the procedure with no complications. Opportunity given to patient to ask questions for any clarifications. Questions answered. Wilkie Drought, RN.

## 2024-08-08 NOTE — Progress Notes (Cosign Needed Addendum)
 [employer] [facility] DEPARTMENT [facility] [address] [place] [address] Main phone: [phone]  Family Planning Visit - Initial Visit  Subjective:  Kelly Hudson is a 28 y.o.  [id number]   being seen today for an initial annual visit and to discuss reproductive life planning.  The patient is currently using hormonal implant for pregnancy prevention. Patient does not want a pregnancy in the next year.   Patient reports they are looking for a method with the following characteristics:  Something hormone free that she can control  Patient has the following medical conditions: Patient Active Problem List   Diagnosis Date Noted   Biliary dyskinesia    Precipitous delivery, delivered (current hospitalization) 01/17/2016   Postpartum care following vaginal delivery 01/16/2016   Spotting affecting pregnancy in second trimester 10/10/2015   Chief Complaint  Patient presents with   Annual Exam    Pt is here for PE and Nexplanon removal    HPI Patient reports desire for Nexplanon removal. Was placed 3 years ago. Has had difficulty with some irregular bleeding.  Has noticed a rash that comes/goes for last month, not itchy. Small bumps on her forearms. No changes in soaps/detergents. Swelling of feet bilaterally, starts at ankles. Started 6 years ago, saw a foot specialist. Does get uncomfortable at times, has difficulty walking long distances. Has PCP for these concerns.  Review of Systems  Cardiovascular:        Swelling of feet  Skin:  Positive for rash.  All other systems reviewed and are negative.  Diabetes screening This patient is 28 y.o. with a BMI of Body mass index is 26.5 kg/m.SABRA  Is patient eligible for diabetes screening (age >35 and BMI >25)?  no  Was Hgb A1c ordered? no  STI screening Patient reports 1 of partners in last year.  Does this patient desire STI screening?  No - declines  Cervical Cancer Screening    Declines today, will schedule for a different day  Result Date Procedure Results Follow-ups  04/23/2020 Cytology - PAP Adequacy: Satisfactory for evaluation; transformation zone component PRESENT. Diagnosis: - Negative for intraepithelial lesion or malignancy (NILM)   08/02/2017 IGP,CtNgTv,rfx Aptima HPV ASCU DIAGNOSIS:: Comment Specimen adequacy:: Comment Clinician Provided ICD10: Comment Performed by:: Comment PAP Smear Comment: . Note:: Comment Test Methodology: Comment PAP Reflex: Comment Chlamydia, Nuc. Acid Amp: Negative Gonococcus, Nuc. Acid Amp: Negative Trich vag by NAA: Negative    Health Maintenance Due  Topic Date Due   HIV Screening  Never done   Hepatitis C Screening  Never done   DTaP/Tdap/Td (1 - Tdap) Never done   Hepatitis B Vaccines 19-59 Average Risk (1 of 3 - 19+ 3-dose series) Never done   HPV VACCINES (3 - 3-dose series) 10/24/2020   Cervical Cancer Screening (HPV/Pap Cotest)  04/21/2024   INFLUENZA VACCINE  Never done   COVID-19 Vaccine (1 - 2024-25 season) Never done   The following portions of the patient's history were reviewed and updated as appropriate: allergies, current medications, past family history, past medical history, past social history, past surgical history and problem list. Problem list updated.  See flowsheet for further details and programmatic requirements Hyperlink available at the top of the signed note in blue.  Flow sheet content below:  Pregnancy Intention Screening Does the patient want to become pregnant in the next year?: No Does the patient's partner want to become pregnant in the next year?: No Would the patient like to discuss contraceptive  options today?: No Sexual History What age did you start your period?: 10 How often do you have your period?: irregular Date of last sex?: 08/01/24 (t-7) Has the patient had unprotected sex within the last 5 days?: No Do you have sex with men, women, both men and women?: Men  only In the past 2 months how many partners have you had sex with?: 1 In the past 12 months, how many partners have you had sex with?: 1 Is it possible that any of your sex partners in the past 12 months had sex with someone else whild they were still in a sexual relationship with you?: No What ways do you have sex?: Vaginal, Oral Do you or your partner use condoms and/or dental dams every time you have vaginal, oral or anal sex?: No Do you douche?: No Date of last HIV test?:  (none) Have you ever had an STD?: No Have any of your partners had an STD?: No Have you or your partner ever shot up drugs?: No Have any of your partners used drugs in the past?: No Have you or your partners exchanged money or drugs for sex?: No  Objective:   Vitals:   08/08/24 1313  BP: 115/65  Pulse: 73  Weight: 164 lb 3.2 oz (74.5 kg)  Height: 5' 6 (1.676 m)   Physical Exam Constitutional:      Appearance: Normal appearance.  HENT:     Head: Normocephalic.     Mouth/Throat:     Lips: Pink.     Mouth: Mucous membranes are moist.  Eyes:     General: No scleral icterus.       Right eye: No discharge.        Left eye: No discharge.  Cardiovascular:     Pulses: Normal pulses.     Comments: Edema +1 of feet, ends at base of her ankles. Does not continue up her leg. Pulmonary:     Effort: Pulmonary effort is normal.  Abdominal:     General: Abdomen is flat.     Palpations: Abdomen is soft.  Lymphadenopathy:     Head:     Right side of head: No submandibular or tonsillar adenopathy.     Left side of head: No submandibular or tonsillar adenopathy.     Cervical: No cervical adenopathy.     Right cervical: No superficial or posterior cervical adenopathy.    Left cervical: No superficial or posterior cervical adenopathy.     Upper Body:     Right upper body: No supraclavicular adenopathy.     Left upper body: No supraclavicular adenopathy.  Skin:    General: Skin is warm and dry.     Findings:  Rash present. Rash is papular.     Comments: Papular rash (mild) of forearms  Neurological:     Mental Status: She is alert.  Psychiatric:        Mood and Affect: Mood normal.        Behavior: Behavior normal.    Assessment and Plan:  Kelly Hudson is a 28 y.o. female presenting to the [facility] Department for an initial annual wellness/contraceptive visit  Family planning Contraception counseling:  Reviewed options based on patient desire and reproductive life plan. Patient is interested in natural family planning/use of [employer] app. Discussed fertile cervical mucus, basal body temperature monitoring. Discussed use of condoms until cycles normalize post Nexplanon removal and she is feeling confident with natural family planning method of choice.  Risks, benefits, and typical effectiveness rates were reviewed.  Questions were answered.  Written information was also given to the patient to review.    The patient will follow up when able for her pap test. The patient was told to call with any further questions, or with any concerns about this method of contraception.  Emphasized use of condoms 100% of the time for STI prevention.  Emergency Contraception Precautions (ECP): Patient assessed for need of ECP. She is not a candidate based on LARC in place and unexpired .   Pap test overdue. Last pap was NILM in 2021, no HPV testing was done. Patient reports she is not prepared to have it done today but will schedule appointment. Declines STI testing today.  2. Nexplanon removal  Procedure:  Nexplanon Removal Patient identified, informed consent performed, consent signed.   Appropriate time out taken. Nexplanon site identified in the patient's right arm.  Area prepped in usual sterile fashon. 1 ml of 1% lidocaine  with Epinephrine  was used to anesthetize the area at the distal end of the implant and along implant site. A small stab incision was made right beside the  implant on the distal portion.  The Nexplanon rod was grasped using hemostats and removed without difficulty.  There was minimal blood loss. There were no complications.  Steri-strips were applied over the small incision.  A pressure bandage was applied to reduce any bruising.  The patient tolerated the procedure well and was given post procedure instructions.     Nexplanon:   Counseled patient to take OTC analgesic starting as soon as lidocaine  starts to wear off and take regularly for at least 48 hr to decrease discomfort.  Specifically to take with food or milk to decrease stomach upset and for IB 600 mg (3 tablets) every 6 hrs; IB 800 mg (4 tablets) every 8 hrs; or Aleve 2 tablets every 12 hrs.   Return when able for pap test. Follow up regarding her rash, edema of feet with PCP. Has already seen primary care for these issues.  No follow-ups on file.  Future Appointments  Date Time Provider Department Center  10/04/2024  2:10 PM Letha Renshaw, CNM [facility]    Damien FORBES Satchel, NP

## 2024-10-04 ENCOUNTER — Encounter: Payer: Self-pay | Admitting: Obstetrics and Gynecology

## 2024-11-27 ENCOUNTER — Ambulatory Visit: Payer: Self-pay

## 2024-11-27 VITALS — BP 115/69 | HR 82 | Ht 66.0 in | Wt 161.0 lb

## 2024-11-27 DIAGNOSIS — Z113 Encounter for screening for infections with a predominantly sexual mode of transmission: Secondary | ICD-10-CM

## 2024-11-27 DIAGNOSIS — Z3009 Encounter for other general counseling and advice on contraception: Secondary | ICD-10-CM | POA: Diagnosis not present

## 2024-11-27 DIAGNOSIS — Z124 Encounter for screening for malignant neoplasm of cervix: Secondary | ICD-10-CM

## 2024-11-27 LAB — WET PREP FOR TRICH, YEAST, CLUE
Clue Cell Exam: NEGATIVE
Trichomonas Exam: NEGATIVE
Yeast Exam: NEGATIVE

## 2024-11-27 NOTE — Progress Notes (Signed)
 " [employer] [facility] [address] [place] [address] Main phone: [phone]  Women's Health Problem Visit   Subjective:  Kelly Hudson is a 28 y.o. being seen today for Pap smear.   Chief Complaint  Patient presents with   Acute Visit    HPI Patient reports ever since she got her nexplanon removed 2-3 months ago she has been having reoccurring yeast infections. She reports her last yeast infection was 3 weeks ago but went away with the use of OTC monistat. She denies vaginal irritation, itching or discharge at visit today.  Does the patient have a current or past history of drug use? No   No components found for: HCV]  Health Maintenance Due  Topic Date Due   HIV Screening  Never done   Hepatitis C Screening  Never done   DTaP/Tdap/Td (1 - Tdap) Never done   Hepatitis B Vaccines 19-59 Average Risk (1 of 3 - 19+ 3-dose series) Never done   HPV VACCINES (3 - 3-dose series) 10/24/2020   Cervical Cancer Screening (HPV/Pap Cotest)  04/21/2024   Influenza Vaccine  Never done   COVID-19 Vaccine (1 - 2025-26 season) Never done    Review of Systems  Constitutional: Negative.   HENT: Negative.    Eyes: Negative.   Respiratory: Negative.    Cardiovascular: Negative.   Gastrointestinal: Negative.   Genitourinary: Negative.   Musculoskeletal: Negative.   Skin: Negative.   Neurological: Negative.   Psychiatric/Behavioral: Negative.      The following portions of the patient's history were reviewed and updated as appropriate: allergies, current medications, past family history, past medical history, past social history, past surgical history and problem list. Problem list updated.  See flowsheet for other program required questions.  Objective:   Vitals:   11/27/24 1521  BP: 115/69  Pulse: 82  Weight: 161 lb (73 kg)  Height: 5' 6 (1.676 m)    Physical Exam Vitals and nursing note  reviewed. Exam conducted with a chaperone present Brett Orange, CMA).  Constitutional:      Appearance: Normal appearance. She is normal weight.  HENT:     Head: Normocephalic and atraumatic.     Nose: Nose normal.     Mouth/Throat:     Pharynx: Oropharynx is clear.  Eyes:     Conjunctiva/sclera: Conjunctivae normal.     Pupils: Pupils are equal, round, and reactive to light.  Cardiovascular:     Pulses: Normal pulses.  Pulmonary:     Effort: Pulmonary effort is normal.  Abdominal:     General: Abdomen is flat.     Palpations: Abdomen is soft.  Genitourinary:    General: Normal vulva.     Exam position: Lithotomy position.     Vagina: Normal.     Comments: Pap and vaginal swabs collected. Musculoskeletal:        General: Normal range of motion.     Cervical back: Normal range of motion.  Skin:    General: Skin is warm and dry.  Neurological:     General: No focal deficit present.     Mental Status: She is alert and oriented to person, place, and time. Mental status is at baseline.  Psychiatric:        Mood and Affect: Mood normal.        Behavior: Behavior normal.        Thought Content: Thought content normal.     Assessment and Plan:  Kelly Hudson is a 28 y.o. female presenting to the [facility] Department for a Women's Health problem visit  1. Screening for cervical cancer (Primary) -Pap Collected.  - IGP, Aptima HPV  2. Screening for venereal disease -Wet Prep: negative -Enc pt to start OTC probiotics to help aid in production of healthy bacteria, can lower  chances of developing recurrent yeast infections.  - WET PREP FOR TRICH, YEAST, CLUE   No follow-ups on file.  No future appointments.   Hardin GORMAN Pouch, NP "

## 2024-11-28 ENCOUNTER — Encounter: Payer: Self-pay | Admitting: Family Medicine

## 2024-11-28 ENCOUNTER — Ambulatory Visit: Payer: Self-pay | Admitting: Family Medicine

## 2024-11-28 NOTE — Progress Notes (Signed)
 Results of wet prep already reviewed with patient in clinic at time of visit. No further action needed.   Dorothyann Helling, MD 11/28/2024  6:06 PM

## 2024-11-29 LAB — IGP, APTIMA HPV
HPV Aptima: NEGATIVE
PAP Smear Comment: 0
